# Patient Record
Sex: Male | Born: 1965 | Race: Black or African American | Hispanic: No | Marital: Married | Smoking: Never smoker
Health system: Southern US, Community
[De-identification: ages and names within clinical notes are randomized; demographics above are authoritative.]

## PROBLEM LIST (undated history)

## (undated) DIAGNOSIS — I10 Essential (primary) hypertension: Secondary | ICD-10-CM

## (undated) HISTORY — DX: Essential (primary) hypertension: I10

---

## 2017-09-03 ENCOUNTER — Telehealth: Payer: Self-pay | Admitting: Urology

## 2017-09-03 ENCOUNTER — Encounter: Payer: Self-pay | Admitting: Urology

## 2017-09-03 ENCOUNTER — Ambulatory Visit: Payer: Federal, State, Local not specified - PPO | Admitting: Urology

## 2017-09-03 VITALS — BP 133/77 | HR 76 | Ht 68.0 in | Wt 173.1 lb

## 2017-09-03 DIAGNOSIS — R972 Elevated prostate specific antigen [PSA]: Secondary | ICD-10-CM

## 2017-09-03 DIAGNOSIS — N529 Male erectile dysfunction, unspecified: Secondary | ICD-10-CM | POA: Diagnosis not present

## 2017-09-03 DIAGNOSIS — N401 Enlarged prostate with lower urinary tract symptoms: Secondary | ICD-10-CM

## 2017-09-03 DIAGNOSIS — I10 Essential (primary) hypertension: Secondary | ICD-10-CM | POA: Insufficient documentation

## 2017-09-03 DIAGNOSIS — N138 Other obstructive and reflux uropathy: Secondary | ICD-10-CM

## 2017-09-03 LAB — URINALYSIS, COMPLETE
Bilirubin, UA: NEGATIVE
Glucose, UA: NEGATIVE
KETONES UA: NEGATIVE
LEUKOCYTES UA: NEGATIVE
NITRITE UA: NEGATIVE
Protein, UA: NEGATIVE
Specific Gravity, UA: 1.025 (ref 1.005–1.030)
UUROB: 0.2 mg/dL (ref 0.2–1.0)
pH, UA: 5.5 (ref 5.0–7.5)

## 2017-09-03 NOTE — Telephone Encounter (Signed)
Would you send my note to Dr. Linzie Collin?

## 2017-09-03 NOTE — Progress Notes (Addendum)
09/03/2017 10:10 AM   Joseph Ho 11-18-1966 [id number]  Referring provider: Jerl Mina, MD [address] [facility] [place], [place] [address]  Chief Complaint  Patient presents with  . Elevated PSA    HPI: Patient is a 51 year old African American male who is referred by Dr. Burnett Sheng for elevated PSA .  PSA trend has been:  2.92 ng/mL on 08/07/2015  3.55 ng/mL on 07/30/2016  4.87 ng/mL on 08/06/2017  BPH WITH LUTS  (prostate and/or bladder) His IPSS score today is 1, which is mild lower urinary tract symptomatology.  He is delighted with his quality life due to his urinary symptoms.  His major complaint today is nocturia x 1.  He has had these symptoms for on and off for years.  He states that it not very often that he gets up at night.  He denies any dysuria, hematuria or suprapubic pain.   He also denies any recent fevers, chills, nausea or vomiting.  His father had fatal prostate cancer and passed away at age 39.        IPSS    Row Name 09/03/17 0900         International Prostate Symptom Score   How often have you had the sensation of not emptying your bladder? Not at All     How often have you had to urinate less than every two hours? Not at All     How often have you found you stopped and started again several times when you urinated? Not at All     How often have you found it difficult to postpone urination? Not at All     How often have you had a weak urinary stream? Not at All     How often have you had to strain to start urination? Not at All     How many times did you typically get up at night to urinate? 1 Time     Total IPSS Score 1       Quality of Life due to urinary symptoms   If you were to spend the rest of your life with your urinary condition just the way it is now how would you feel about that? Delighted        Score:  1-7 Mild 8-19 Moderate 20-35 Severe    Erectile dysfunction His SHIM score is 22, which is no ED.   His  libido is preserved.   His risk factors for ED are age, BPH and HTN.   He denies any painful erections or curvatures with his erections.   He is still having spontaneous erections.       SHIM    Row Name 09/03/17 0941         SHIM: Over the last 6 months:   How do you rate your confidence that you could get and keep an erection? High     When you had erections with sexual stimulation, how often were your erections hard enough for penetration (entering your partner)? Most Times (much more than half the time)     During sexual intercourse, how often were you able to maintain your erection after you had penetrated (entered) your partner? Most Times (much more than half the time)     During sexual intercourse, how difficult was it to maintain your erection to completion of intercourse? Not Difficult     When you attempted sexual intercourse, how often was it satisfactory for you? Almost  Always or Always       SHIM Total Score   SHIM 22        Score: 1-7 Severe ED 8-11 Moderate ED 12-16 Mild-Moderate ED 17-21 Mild ED 22-25 No ED    PMH: Past Medical History:  Diagnosis Date  . Hypertension     Surgical History: History reviewed. No pertinent surgical history.  Home Medications:  Allergies as of 09/03/2017   No Known Allergies     Medication List       Accurate as of 09/03/17 10:10 AM. Always use your most recent med list.          lisinopril-hydrochlorothiazide 20-12.5 MG tablet Commonly known as:  PRINZIDE,ZESTORETIC Take by mouth.   meloxicam 15 MG tablet Commonly known as:  MOBIC Take by mouth.            Discharge Care Instructions        Start     Ordered   09/03/17 0000  Urinalysis, Complete     09/03/17 0910   09/03/17 0000  PSA     09/03/17 0935      Allergies: No Known Allergies  Family History: Family History  Problem Relation Age of Onset  . Prostate cancer Father   . Bladder Cancer Neg Hx   . Kidney cancer Neg Hx     Social  History:  reports that he has never smoked. He has never used smokeless tobacco. He reports that he does not drink alcohol or use drugs.  ROS: UROLOGY Frequent Urination?: No Hard to postpone urination?: No Burning/pain with urination?: No Get up at night to urinate?: Yes Leakage of urine?: No Urine stream starts and stops?: No Trouble starting stream?: No Do you have to strain to urinate?: No Blood in urine?: No Urinary tract infection?: No Sexually transmitted disease?: No Injury to kidneys or bladder?: No Painful intercourse?: No Weak stream?: No Erection problems?: No Penile pain?: No  Gastrointestinal Nausea?: No Vomiting?: No Indigestion/heartburn?: No Diarrhea?: No Constipation?: No  Constitutional Fever: No Night sweats?: No Weight loss?: No Fatigue?: No  Skin Skin rash/lesions?: No Itching?: No  Eyes Blurred vision?: No Double vision?: No  Ears/Nose/Throat Sore throat?: No Sinus problems?: No  Hematologic/Lymphatic Swollen glands?: No Easy bruising?: No  Cardiovascular Leg swelling?: No Chest pain?: No  Respiratory Cough?: No Shortness of breath?: No  Endocrine Excessive thirst?: No  Musculoskeletal Back pain?: No Joint pain?: Yes  Neurological Headaches?: No Dizziness?: No  Psychologic Depression?: No Anxiety?: No  Physical Exam: BP 133/77 (BP Location: Left Arm, Patient Position: Sitting, Cuff Size: Normal)   Pulse 76   Ht  (1.727 m)   Wt 173 lb 1.6 oz (78.5 kg)   BMI 26.32 kg/m   Constitutional: Well nourished. Alert and oriented, No acute distress. HEENT: [place] AT, moist mucus membranes. Trachea midline, no masses. Cardiovascular: No clubbing, cyanosis, or edema. Respiratory: Normal respiratory effort, no increased work of breathing. GI: Abdomen is soft, non tender, non distended, no abdominal masses. Liver and spleen not palpable.  No hernias appreciated.  Stool sample for occult testing is not indicated.   GU: No  CVA tenderness.  No bladder fullness or masses.  Patient with uncircumcised phallus. Foreskin easily retracted  Urethral meatus is patent.  No penile discharge. No penile lesions or rashes. Scrotum without lesions, cysts, rashes and/or edema.  Testicles are located scrotally bilaterally. No masses are appreciated in the testicles. Left and right epididymis are normal. Rectal: Patient with  normal  sphincter tone. Anus and perineum without scarring or rashes. No rectal masses are appreciated. Prostate is approximately 35 grams, no nodules are appreciated. Seminal vesicles are normal. Skin: No rashes, bruises or suspicious lesions. Lymph: No cervical or inguinal adenopathy. Neurologic: Grossly intact, no focal deficits, moving all 4 extremities. Psychiatric: Normal mood and affect.  Laboratory Data: Urinalysis Negative.  See EPIC.  I have reviewed the labs.   Assessment & Plan:    1. Elevated PSA  - Urinalysis, Complete  - PSA  2. BPH with LUTS  - IPSS score is 1/0  - Continue conservative management, avoiding bladder irritants and timed voiding's  - RTC pending PSA  3. Erectile dysfunction  - SHIM score is 22  - RTC pending PSA   Return for pending PSA results.  These notes generated with voice recognition software. I apologize for typographical errors.  Michiel Cowboy, PA-C  [facility] [address], [address] [place], [place] [address] [phone]

## 2017-09-03 NOTE — Telephone Encounter (Signed)
done

## 2017-09-04 ENCOUNTER — Telehealth: Payer: Self-pay

## 2017-09-04 LAB — PSA: PROSTATE SPECIFIC AG, SERUM: 4.1 ng/mL — AB (ref 0.0–4.0)

## 2017-09-04 NOTE — Telephone Encounter (Signed)
Spoke w/ Amy at [facility] test added on

## 2017-09-04 NOTE — Telephone Encounter (Signed)
-----   Message from [name], PA-C sent at 09/04/2017  1:26 PM EDT ----- I let Mr. Dozal know his PSA results.

## 2017-09-04 NOTE — Telephone Encounter (Signed)
-----   Message from [name], PA-C sent at 09/04/2017  8:13 AM EDT ----- Please add a free and total PSA.

## 2017-09-05 ENCOUNTER — Other Ambulatory Visit: Payer: Self-pay | Admitting: Urology

## 2017-09-06 LAB — PSA, TOTAL AND FREE
PROSTATE SPECIFIC AG, SERUM: 4.5 ng/mL — AB (ref 0.0–4.0)
PSA FREE: 0.75 ng/mL
PSA, Free Pct: 16.7 %

## 2017-09-06 LAB — SPECIMEN STATUS REPORT

## 2017-09-09 ENCOUNTER — Telehealth: Payer: Self-pay

## 2017-09-09 DIAGNOSIS — R972 Elevated prostate specific antigen [PSA]: Secondary | ICD-10-CM

## 2017-09-09 NOTE — Telephone Encounter (Signed)
-----   Message from [name], PA-C sent at 09/08/2017 12:23 PM EDT ----- I spoke with [name] and explain the results of his blood work to him.  I gave him the option of having a prostate biopsy at this time, obtaining a 4K score for more information or repeating the PSA in 3 months.  I did advise that the prostate biopsy would be the only at this time to know if there is a prostate cancer present and patient understands the risk.   We will make a laboratory appointment for him to have his PSA drawn in 3 months and contact him with that appointment.

## 2017-12-16 ENCOUNTER — Other Ambulatory Visit: Payer: Federal, State, Local not specified - PPO

## 2017-12-16 DIAGNOSIS — R972 Elevated prostate specific antigen [PSA]: Secondary | ICD-10-CM

## 2017-12-17 LAB — PSA: PROSTATE SPECIFIC AG, SERUM: 3.6 ng/mL (ref 0.0–4.0)

## 2017-12-19 ENCOUNTER — Telehealth: Payer: Self-pay

## 2017-12-19 NOTE — Telephone Encounter (Signed)
-----   Message from Harle BattiestShannon A McGowan, PA-C sent at 12/17/2017  9:53 AM EST ----- Please let Mr. Obie DredgeLangley know that his PSA has come down.  I would like to see him in March for an exam.

## 2017-12-19 NOTE — Telephone Encounter (Signed)
Patient notified , please schedule follow up 

## 2017-12-22 ENCOUNTER — Telehealth: Payer: Self-pay | Admitting: Urology

## 2017-12-22 NOTE — Telephone Encounter (Signed)
App made and mailed to pateint  Joseph Ho

## 2018-02-18 NOTE — Progress Notes (Signed)
02/19/2018 9:50 AM   Joseph Ho Aug 09, 1966 [id number]  Referring provider: Jerl MinaHedrick, James, MD [address] [facility] [place], [place] [id number]  No chief complaint on file.   HPI: Patient is a [age] year old African American male with a history of elevated PSA, BPH with LU TS and ED who presents today for a three months follow up.    History of elevated PSA PSA trend has been:  2.92 ng/mL on 08/07/2015  3.55 ng/mL on 07/30/2016  4.87 ng/mL on 08/06/2017  4.5 ng/mL in 08/2017 - PSA, free 0.75 - PSA, Free Pct 16.7% - 17% probability  3.6 ng/mL in 12/2017  BPH WITH LUTS  (prostate and/or bladder) His IPSS score today is 3, which is mild lower urinary tract symptomatology.  He is delighted with his quality life due to his urinary symptoms.  His previous I PSS score was 1/0.  He denies any dysuria, hematuria or suprapubic pain.   He also denies any recent fevers, chills, nausea or vomiting.  His father had fatal prostate cancer and passed away at age 53.    IPSS    Row Name 02/19/18 0900         International Prostate Symptom Score   How often have you had the sensation of not emptying your bladder?  Not at All     How often have you had to urinate less than every two hours?  Not at All     How often have you found you stopped and started again several times when you urinated?  Less than 1 in 5 times     How often have you found it difficult to postpone urination?  Less than 1 in 5 times     How often have you had a weak urinary stream?  Not at All     How often have you had to strain to start urination?  Not at All     How many times did you typically get up at night to urinate?  1 Time     Total IPSS Score  3       Quality of Life due to urinary symptoms   If you were to spend the rest of your life with your urinary condition just the way it is now how would you feel about that?  Delighted        Score:  1-7 Mild 8-19 Moderate 20-35 Severe    Erectile  dysfunction His SHIM score is 20, which is mild ED.   His previous SHIM was 22.  His libido is preserved.   His risk factors for ED are age, BPH and HTN.   He denies any painful erections or curvatures with his erections.   He is still having spontaneous erections.  SHIM    Row Name 02/19/18 0933         SHIM: Over the last 6 months:   How do you rate your confidence that you could get and keep an erection?  Moderate     When you had erections with sexual stimulation, how often were your erections hard enough for penetration (entering your partner)?  Most Times (much more than half the time)     During sexual intercourse, how often were you able to maintain your erection after you had penetrated (entered) your partner?  Most Times (much more than half the time)     During sexual intercourse, how difficult was it to maintain your erection to completion  of intercourse?  Not Difficult     When you attempted sexual intercourse, how often was it satisfactory for you?  Most Times (much more than half the time)       SHIM Total Score   SHIM  20        Score: 1-7 Severe ED 8-11 Moderate ED 12-16 Mild-Moderate ED 17-21 Mild ED 22-25 No ED    PMH: Past Medical History:  Diagnosis Date  . Hypertension     Surgical History: No past surgical history on file.  Home Medications:  Allergies as of 02/19/2018   No Known Allergies     Medication List        Accurate as of 02/19/18  9:50 AM. Always use your most recent med list.          lisinopril-hydrochlorothiazide 20-12.5 MG tablet Commonly known as:  PRINZIDE,ZESTORETIC Take by mouth.   meloxicam 15 MG tablet Commonly known as:  MOBIC Take by mouth.       Allergies: No Known Allergies  Family History: Family History  Problem Relation Age of Onset  . Prostate cancer Father   . Bladder Cancer Neg Hx   . Kidney cancer Neg Hx     Social History:  reports that  has never smoked. he has never used smokeless tobacco. He  reports that he does not drink alcohol or use drugs.  ROS: UROLOGY Frequent Urination?: No Hard to postpone urination?: No Burning/pain with urination?: No Get up at night to urinate?: No Leakage of urine?: No Urine stream starts and stops?: No Trouble starting stream?: No Do you have to strain to urinate?: No Blood in urine?: No Urinary tract infection?: No Sexually transmitted disease?: No Injury to kidneys or bladder?: No Painful intercourse?: No Weak stream?: No Erection problems?: No Penile pain?: No  Gastrointestinal Nausea?: No Vomiting?: No Indigestion/heartburn?: No Diarrhea?: No Constipation?: No  Constitutional Fever: No Night sweats?: No Weight loss?: No Fatigue?: No  Skin Skin rash/lesions?: No Itching?: No  Eyes Blurred vision?: No Double vision?: No  Ears/Nose/Throat Sore throat?: No Sinus problems?: No  Hematologic/Lymphatic Swollen glands?: No Easy bruising?: No  Cardiovascular Leg swelling?: No Chest pain?: No  Respiratory Cough?: No Shortness of breath?: No  Endocrine Excessive thirst?: No  Musculoskeletal Back pain?: No Joint pain?: No  Neurological Headaches?: No Dizziness?: No  Psychologic Depression?: No Anxiety?: No  Physical Exam: BP (!) 141/96   Pulse 65   Resp 16   Ht 5\' 8"  (1.727 m)   Wt 174 lb (78.9 kg)   SpO2 98%   BMI 26.46 kg/m   Constitutional: Well nourished. Alert and oriented, No acute distress. HEENT: [place] AT, moist mucus membranes. Trachea midline, no masses. Cardiovascular: No clubbing, cyanosis, or edema. Respiratory: Normal respiratory effort, no increased work of breathing. GI: Abdomen is soft, non tender, non distended, no abdominal masses. Liver and spleen not palpable.  No hernias appreciated.  Stool sample for occult testing is not indicated.   GU: No CVA tenderness.  No bladder fullness or masses.  Patient with uncircumcised phallus.  Foreskin easily retracted  Urethral meatus is  patent.  No penile discharge. No penile lesions or rashes. Scrotum without lesions, cysts, rashes and/or edema.  Testicles are located scrotally bilaterally. No masses are appreciated in the testicles. Left and right epididymis are normal. Rectal: Patient with  normal sphincter tone. Anus and perineum without scarring or rashes. No rectal masses are appreciated. Prostate is approximately 35 grams, no nodules are appreciated.  Seminal vesicles are normal. Skin: No rashes, bruises or suspicious lesions. Lymph: No cervical or inguinal adenopathy. Neurologic: Grossly intact, no focal deficits, moving all 4 extremities. Psychiatric: Normal mood and affect.    Laboratory Data: See PSA in HPI  I have reviewed the labs.   Assessment & Plan:    1. History of elevated PSA  - Recent PSA 3.6  - PSA repeated today  2. BPH with LUTS  - IPSS score is 3/0, it is slightly worse  - Continue conservative management, avoiding bladder irritants and timed voiding's  - RTC pending PSA  3. Erectile dysfunction  - SHIM score is 20 ,it is worsening  - RTC pending PSA   Return for pending PSA results .  These notes generated with voice recognition software. I apologize for typographical errors.  Michiel Cowboy, PA-C  [facility] [address], [address] [place], [place] [address] [phone]

## 2018-02-19 ENCOUNTER — Ambulatory Visit: Payer: Federal, State, Local not specified - PPO | Admitting: Urology

## 2018-02-19 ENCOUNTER — Encounter: Payer: Self-pay | Admitting: Urology

## 2018-02-19 VITALS — BP 141/96 | HR 65 | Resp 16 | Ht 68.0 in | Wt 174.0 lb

## 2018-02-19 DIAGNOSIS — Z87898 Personal history of other specified conditions: Secondary | ICD-10-CM

## 2018-02-19 DIAGNOSIS — N529 Male erectile dysfunction, unspecified: Secondary | ICD-10-CM

## 2018-02-19 DIAGNOSIS — N138 Other obstructive and reflux uropathy: Secondary | ICD-10-CM

## 2018-02-19 DIAGNOSIS — N401 Enlarged prostate with lower urinary tract symptoms: Secondary | ICD-10-CM

## 2018-02-20 LAB — PSA: PROSTATE SPECIFIC AG, SERUM: 4.5 ng/mL — AB (ref 0.0–4.0)

## 2018-02-20 NOTE — Addendum Note (Signed)
Addended by: Vickki HearingHOMPSON, CHRISTOPHER L on: 02/20/2018 08:54 AM   Modules accepted: Orders

## 2018-03-04 ENCOUNTER — Other Ambulatory Visit: Payer: Federal, State, Local not specified - PPO

## 2018-03-04 ENCOUNTER — Other Ambulatory Visit: Payer: Self-pay

## 2018-03-04 DIAGNOSIS — Z87898 Personal history of other specified conditions: Secondary | ICD-10-CM

## 2018-03-05 ENCOUNTER — Telehealth: Payer: Self-pay

## 2018-03-05 LAB — PSA: Prostate Specific Ag, Serum: 4.3 ng/mL — ABNORMAL HIGH (ref 0.0–4.0)

## 2018-03-05 NOTE — Telephone Encounter (Signed)
-----   Message from Harle BattiestShannon A McGowan, PA-C sent at 03/05/2018  7:51 AM EDT ----- Please let Mr. Joseph DredgeLangley know that his PSA is somewhat stable at 4.3.  I would like it repeated in 3 months.

## 2018-03-05 NOTE — Telephone Encounter (Signed)
LMOM

## 2018-03-06 NOTE — Telephone Encounter (Signed)
Spoke w/ pt and informed him of results. Pt will repeat psa in 3 months.

## 2018-03-14 LAB — PSA, TOTAL AND FREE
PROSTATE SPECIFIC AG, SERUM: 4.7 ng/mL — AB (ref 0.0–4.0)
PSA, Free Pct: 18.1 %
PSA, Free: 0.85 ng/mL

## 2018-03-14 LAB — SPECIMEN STATUS REPORT

## 2018-06-05 ENCOUNTER — Other Ambulatory Visit: Payer: Federal, State, Local not specified - PPO

## 2018-06-05 DIAGNOSIS — Z87898 Personal history of other specified conditions: Secondary | ICD-10-CM

## 2018-06-06 LAB — PSA: PROSTATE SPECIFIC AG, SERUM: 4 ng/mL (ref 0.0–4.0)

## 2018-06-10 ENCOUNTER — Telehealth: Payer: Self-pay | Admitting: Urology

## 2018-06-10 NOTE — Telephone Encounter (Signed)
Pt called asking for PSA results from last Friday.  I read results to him 4.0.

## 2018-06-29 ENCOUNTER — Telehealth: Payer: Self-pay | Admitting: Urology

## 2018-06-29 NOTE — Telephone Encounter (Signed)
Left message for patient to call me back. 

## 2018-07-01 ENCOUNTER — Other Ambulatory Visit: Payer: Self-pay | Admitting: Urology

## 2018-07-01 MED ORDER — FINASTERIDE 5 MG PO TABS: 5.0000 mg | ORAL_TABLET | Freq: Every day | ORAL | 3 refills | Status: DC

## 2018-07-01 NOTE — Progress Notes (Signed)
Explained to Mr. Joseph Ho that his PSA is higher than I am comfortable with and I am concerned that this may be the sign of prostate cancer.  I have recommended undergoing a biopsy or taking the medication finasteride to see how the PSA responds. He would like to take the finasteride for one month and repeat the PSA.  I did advise him that if prostate cancer is the cause of his PSA levels, it would cause a delay in diagnosis and treatment.  He is understanding of this and would like to take the finasteride and if no changes are seen in the PSA schedule a biopsy at that time.

## 2018-07-01 NOTE — Telephone Encounter (Signed)
Patient returned your call he can be reached at [phone]   [facility]

## 2018-07-01 NOTE — Telephone Encounter (Signed)
LMOM for patient to call me back 

## 2018-07-24 ENCOUNTER — Other Ambulatory Visit: Payer: Self-pay | Admitting: Family Medicine

## 2018-07-24 DIAGNOSIS — Z87898 Personal history of other specified conditions: Secondary | ICD-10-CM

## 2018-07-30 ENCOUNTER — Other Ambulatory Visit: Payer: Federal, State, Local not specified - PPO

## 2018-07-30 DIAGNOSIS — Z87898 Personal history of other specified conditions: Secondary | ICD-10-CM

## 2018-07-31 LAB — PSA: PROSTATE SPECIFIC AG, SERUM: 3 ng/mL (ref 0.0–4.0)

## 2018-08-16 ENCOUNTER — Telehealth: Payer: Self-pay | Admitting: Urology

## 2018-08-16 NOTE — Telephone Encounter (Signed)
Please call Mr. Auer to schedule a lab visit for a PSA in one month.

## 2018-08-17 NOTE — Telephone Encounter (Signed)
Left message for patient to cb to schedule app If he wants to go to [facility] he does not need an app he can just go to the lab in a month to get this done.   Marcelino Duster

## 2018-08-20 NOTE — Telephone Encounter (Signed)
Did you want a month from his last PSA?   Joseph DusterMichelle

## 2018-08-20 NOTE — Telephone Encounter (Signed)
Yes. One month from last PSA.

## 2018-08-27 ENCOUNTER — Other Ambulatory Visit: Payer: Federal, State, Local not specified - PPO

## 2018-08-27 DIAGNOSIS — Z87898 Personal history of other specified conditions: Secondary | ICD-10-CM

## 2018-08-28 LAB — PSA: PROSTATE SPECIFIC AG, SERUM: 2.6 ng/mL (ref 0.0–4.0)

## 2018-08-31 ENCOUNTER — Ambulatory Visit: Payer: Federal, State, Local not specified - PPO | Admitting: Urology

## 2018-08-31 NOTE — Progress Notes (Deleted)
08/31/2018 5:26 AM   Joseph Ho 1966-01-11 [id number]  Referring provider: Jerl Mina, MD [address] [facility] [name], [place] [address]  No chief complaint on file.   HPI: Patient is a 52 year old African American male with a history of elevated PSA, BPH with LU TS and ED who presents today for a six months follow up.    History of elevated PSA PSA trend has been:  2.92 ng/mL on 08/07/2015  3.55 ng/mL on 07/30/2016  4.87 ng/mL on 08/06/2017  4.5 ng/mL in 08/2017 - PSA, free 0.75 - PSA, Free Pct 16.7% - 17% probability  3.6 ng/mL in 12/2017  4.5 ng/mL in 02/2018  4.0 ng/mL in 05/2018  3.0 ng/mL in 07/2018 - started finasteride   2.6 ng/mL in 08/2018  BPH WITH LUTS  (prostate and/or bladder) His IPSS score today is ***, which is *** lower urinary tract symptomatology.  He is *** with his quality life due to his urinary symptoms.  His previous I PSS score was 3/0.  He denies any dysuria, hematuria or suprapubic pain.   He also denies any recent fevers, chills, nausea or vomiting.  His father had fatal prostate cancer and passed away at age 39.      Score:  1-7 Mild 8-19 Moderate 20-35 Severe    Erectile dysfunction His SHIM score is ***, which is *** ED.   His previous SHIM was 20.  His libido is preserved.   His risk factors for ED are age, BPH and HTN.   He denies any painful erections or curvatures with his erections.   He is still having spontaneous erections.    Score: 1-7 Severe ED 8-11 Moderate ED 12-16 Mild-Moderate ED 17-21 Mild ED 22-25 No ED    PMH: Past Medical History:  Diagnosis Date  . Hypertension     Surgical History: No past surgical history on file.  Home Medications:  Allergies as of 08/31/2018   No Known Allergies     Medication List        Accurate as of 08/31/18  5:26 AM. Always use your most recent med list.          finasteride 5 MG tablet Commonly known as:  PROSCAR Take 1 tablet (5 mg total) by  mouth daily.   lisinopril-hydrochlorothiazide 20-12.5 MG tablet Commonly known as:  PRINZIDE,ZESTORETIC Take by mouth.       Allergies: No Known Allergies  Family History: Family History  Problem Relation Age of Onset  . Prostate cancer Father   . Bladder Cancer Neg Hx   . Kidney cancer Neg Hx     Social History:  reports that he has never smoked. He has never used smokeless tobacco. He reports that he does not drink alcohol or use drugs.  ROS:                                        Physical Exam: There were no vitals taken for this visit.  Constitutional: Well nourished. Alert and oriented, No acute distress. HEENT: [place] AT, moist mucus membranes. Trachea midline, no masses. Cardiovascular: No clubbing, cyanosis, or edema. Respiratory: Normal respiratory effort, no increased work of breathing. GI: Abdomen is soft, non tender, non distended, no abdominal masses. Liver and spleen not palpable.  No hernias appreciated.  Stool sample for occult testing is not indicated.   GU: No  CVA tenderness.  No bladder fullness or masses.  Patient with circumcised/uncircumcised phallus. ***Foreskin easily retracted***  Urethral meatus is patent.  No penile discharge. No penile lesions or rashes. Scrotum without lesions, cysts, rashes and/or edema.  Testicles are located scrotally bilaterally. No masses are appreciated in the testicles. Left and right epididymis are normal. Rectal: Patient with  normal sphincter tone. Anus and perineum without scarring or rashes. No rectal masses are appreciated. Prostate is approximately *** grams, *** nodules are appreciated. Seminal vesicles are normal. Skin: No rashes, bruises or suspicious lesions. Lymph: No cervical or inguinal adenopathy. Neurologic: Grossly intact, no focal deficits, moving all 4 extremities. Psychiatric: Normal mood and affect.   Laboratory Data: See PSA in HPI  I have reviewed the labs.   Assessment &  Plan:    1. History of elevated PSA Recent PSA 2.6 Continue finasteride RTC in one month for PSA   2. BPH with LUTS IPSS score is 3/0, it is slightly worse Continue conservative management, avoiding bladder irritants and timed voiding's Continue finasteride RTC in 6 months for I PSS, PSA and exam - if PSA continues to reduce  3. Erectile dysfunction SHIM score is 20 ,it is worsening    No follow-ups on file.  These notes generated with voice recognition software. I apologize for typographical errors.  Joseph CowboySHANNON Carmine Youngberg, PA-C  [facility] [address] [place], [place] [address] [phone]

## 2018-09-01 ENCOUNTER — Telehealth: Payer: Self-pay | Admitting: Urology

## 2018-09-01 NOTE — Telephone Encounter (Signed)
He is coming in on Thursday to see you. He had his labs drawn on the 19th and was supposed to see you on the 23rd but missed that app.  Joseph DusterMichelle

## 2018-09-01 NOTE — Telephone Encounter (Signed)
Mr. Joseph Ho did not show for his appointment yesterday.  I need him to come back in one month for a PSA/OV.

## 2018-09-03 ENCOUNTER — Ambulatory Visit (INDEPENDENT_AMBULATORY_CARE_PROVIDER_SITE_OTHER): Payer: Federal, State, Local not specified - PPO | Admitting: Urology

## 2018-09-03 ENCOUNTER — Encounter: Payer: Self-pay | Admitting: Urology

## 2018-09-03 VITALS — BP 130/80 | HR 79 | Ht 69.0 in | Wt 180.7 lb

## 2018-09-03 DIAGNOSIS — N138 Other obstructive and reflux uropathy: Secondary | ICD-10-CM

## 2018-09-03 DIAGNOSIS — Z87898 Personal history of other specified conditions: Secondary | ICD-10-CM

## 2018-09-03 DIAGNOSIS — N401 Enlarged prostate with lower urinary tract symptoms: Secondary | ICD-10-CM | POA: Diagnosis not present

## 2018-09-03 MED ORDER — FINASTERIDE 5 MG PO TABS: 5.0000 mg | ORAL_TABLET | Freq: Every day | ORAL | 3 refills | Status: DC

## 2018-09-03 NOTE — Progress Notes (Signed)
09/03/2018 10:08 AM   Joseph Ho 02/19/1966 [id number]  Referring provider: Jerl Mina, MD [address] [facility] [place], [place] [address]  Chief Complaint  Patient presents with  . Elevated PSA    HPI: Patient is a 52 year old African American male with a history of elevated PSA, BPH with LU TS and ED who presents today for a six months follow up.    History of elevated PSA PSA trend has been:  2.92 ng/mL on 08/07/2015  3.55 ng/mL on 07/30/2016  4.87 ng/mL on 08/06/2017  4.5 ng/mL in 08/2017 - PSA, free 0.75 - PSA, Free Pct 16.7% - 17% probability  3.6 ng/mL in 12/2017  4.5 ng/mL in 02/2018  4.0 ng/mL in 05/2018  3.0 ng/mL in 07/2018 - started finasteride   2.6 ng/mL in 08/2018  BPH WITH LUTS  (prostate and/or bladder) His IPSS score today 1, which is mild lower urinary tract symptomatology.  He is delighted with his quality life due to his urinary symptoms.  His previous I PSS score was 3/0.  He denies any dysuria, hematuria or suprapubic pain.   He also denies any recent fevers, chills, nausea or vomiting.  His father had fatal prostate cancer and passed away at age 62.    IPSS    Row Name 09/03/18 0900         International Prostate Symptom Score   How often have you had the sensation of not emptying your bladder?  Not at All     How often have you had to urinate less than every two hours?  Not at All     How often have you found you stopped and started again several times when you urinated?  Not at All     How often have you found it difficult to postpone urination?  Not at All     How often have you had a weak urinary stream?  Not at All     How often have you had to strain to start urination?  Not at All     How many times did you typically get up at night to urinate?  1 Time     Total IPSS Score  1       Quality of Life due to urinary symptoms   If you were to spend the rest of your life with your urinary condition just the way it is now  how would you feel about that?  Delighted        Score:  1-7 Mild 8-19 Moderate 20-35 Severe    PMH: Past Medical History:  Diagnosis Date  . Hypertension     Surgical History: No past surgical history on file.  Home Medications:  Allergies as of 09/03/2018   No Known Allergies     Medication List        Accurate as of 09/03/18 10:08 AM. Always use your most recent med list.          finasteride 5 MG tablet Commonly known as:  PROSCAR Take 1 tablet (5 mg total) by mouth daily.   lisinopril-hydrochlorothiazide 20-12.5 MG tablet Commonly known as:  PRINZIDE,ZESTORETIC Take by mouth.       Allergies: No Known Allergies  Family History: Family History  Problem Relation Age of Onset  . Prostate cancer Father   . Bladder Cancer Neg Hx   . Kidney cancer Neg Hx     Social History:  reports that he has never  smoked. He has never used smokeless tobacco. He reports that he does not drink alcohol or use drugs.  ROS: UROLOGY Frequent Urination?: No Hard to postpone urination?: No Burning/pain with urination?: No Get up at night to urinate?: No Leakage of urine?: No Urine stream starts and stops?: No Trouble starting stream?: No Do you have to strain to urinate?: No Blood in urine?: No Urinary tract infection?: No Sexually transmitted disease?: No Injury to kidneys or bladder?: No Painful intercourse?: No Weak stream?: No Erection problems?: No Penile pain?: No  Gastrointestinal Nausea?: No Vomiting?: No Indigestion/heartburn?: No Diarrhea?: No Constipation?: No  Constitutional Fever: No Night sweats?: No Weight loss?: No Fatigue?: No  Skin Skin rash/lesions?: No Itching?: No  Eyes Blurred vision?: No Double vision?: No  Ears/Nose/Throat Sore throat?: No Sinus problems?: No  Hematologic/Lymphatic Swollen glands?: No Easy bruising?: No  Cardiovascular Leg swelling?: No Chest pain?: No  Respiratory Cough?: No Shortness of  breath?: No  Endocrine Excessive thirst?: No  Musculoskeletal Back pain?: No Joint pain?: No  Neurological Headaches?: No Dizziness?: No  Psychologic Depression?: No Anxiety?: No  Physical Exam: BP 130/80 (BP Location: Left Arm, Patient Position: Sitting, Cuff Size: Normal)   Pulse 79   Ht 5\' 9"  (1.753 m)   Wt 180 lb 11.2 oz (82 kg)   BMI 26.68 kg/m   Constitutional: Well nourished. Alert and oriented, No acute distress. HEENT: [place] AT, moist mucus membranes. Trachea midline, no masses. Cardiovascular: No clubbing, cyanosis, or edema. Respiratory: Normal respiratory effort, no increased work of breathing. GI: Abdomen is soft, non tender, non distended, no abdominal masses. Liver and spleen not palpable.  No hernias appreciated.  Stool sample for occult testing is not indicated.   GU: No CVA tenderness.  No bladder fullness or masses.  Patient with uncircumcised phallus.   Foreskin easily retracted Urethral meatus is patent.  No penile discharge. No penile lesions or rashes. Scrotum without lesions, cysts, rashes and/or edema.  Testicles are located scrotally bilaterally. No masses are appreciated in the testicles. Left and right epididymis are normal. Rectal: Patient with  normal sphincter tone. Anus and perineum without scarring or rashes. No rectal masses are appreciated. Prostate is approximately 45 grams, no nodules are appreciated. Seminal vesicles are normal. Skin: No rashes, bruises or suspicious lesions. Lymph: No cervical or inguinal adenopathy. Neurologic: Grossly intact, no focal deficits, moving all 4 extremities. Psychiatric: Normal mood and affect.   Laboratory Data: See PSA in HPI  I have reviewed the labs.   Assessment & Plan:    1. History of elevated PSA Recent PSA 2.6 Continue finasteride RTC in two months for PSA only   2. BPH with LUTS IPSS score is 1/0, it is improved Continue conservative management, avoiding bladder irritants and timed  voiding's Continue finasteride RTC in 6 months for I PSS, PSA and exam - if PSA continues to reduce   Return in about 2 months (around 11/03/2018) for PSA only .  These notes generated with voice recognition software. I apologize for typographical errors.  Michiel Cowboy, PA-C  [facility] [address] [place], [place] [address] [phone]

## 2018-09-08 ENCOUNTER — Other Ambulatory Visit: Payer: Self-pay | Admitting: Urology

## 2018-09-08 MED ORDER — TADALAFIL 20 MG PO TABS: 20.0000 mg | ORAL_TABLET | Freq: Every day | ORAL | 3 refills | Status: DC | PRN

## 2018-09-08 NOTE — Progress Notes (Signed)
Rx for Cialis is sent to pharmacy.

## 2018-09-09 ENCOUNTER — Ambulatory Visit: Payer: Federal, State, Local not specified - PPO | Admitting: Urology

## 2018-11-02 ENCOUNTER — Other Ambulatory Visit: Payer: Self-pay

## 2018-11-02 DIAGNOSIS — Z87898 Personal history of other specified conditions: Secondary | ICD-10-CM

## 2018-11-03 ENCOUNTER — Other Ambulatory Visit: Payer: Federal, State, Local not specified - PPO

## 2018-11-03 DIAGNOSIS — Z87898 Personal history of other specified conditions: Secondary | ICD-10-CM

## 2018-11-04 LAB — PSA: PROSTATE SPECIFIC AG, SERUM: 2.7 ng/mL (ref 0.0–4.0)

## 2018-11-16 ENCOUNTER — Other Ambulatory Visit: Payer: Federal, State, Local not specified - PPO

## 2018-11-16 DIAGNOSIS — Z87898 Personal history of other specified conditions: Secondary | ICD-10-CM

## 2018-11-17 LAB — PSA: PROSTATE SPECIFIC AG, SERUM: 2.6 ng/mL (ref 0.0–4.0)

## 2018-11-18 ENCOUNTER — Other Ambulatory Visit: Payer: Self-pay | Admitting: Urology

## 2018-11-18 DIAGNOSIS — R972 Elevated prostate specific antigen [PSA]: Secondary | ICD-10-CM

## 2018-11-18 NOTE — Progress Notes (Signed)
Orders for MRI in chart.

## 2018-12-03 ENCOUNTER — Ambulatory Visit
Admission: RE | Admit: 2018-12-03 | Discharge: 2018-12-03 | Disposition: A | Discharge status: Home / Self Care | Payer: Federal, State, Local not specified - PPO | Source: Ambulatory Visit | Attending: Urology | Admitting: Urology

## 2018-12-03 DIAGNOSIS — R972 Elevated prostate specific antigen [PSA]: Secondary | ICD-10-CM

## 2018-12-03 MED ORDER — GADOBENATE DIMEGLUMINE 529 MG/ML IV SOLN
17.0000 mL | Freq: Once | INTRAVENOUS | Status: AC | PRN
  Administered 2018-12-03: 17 mL via INTRAVENOUS

## 2018-12-11 ENCOUNTER — Other Ambulatory Visit: Payer: Self-pay

## 2018-12-11 DIAGNOSIS — R972 Elevated prostate specific antigen [PSA]: Secondary | ICD-10-CM

## 2019-02-09 ENCOUNTER — Other Ambulatory Visit: Payer: Self-pay

## 2019-02-11 ENCOUNTER — Ambulatory Visit: Payer: Self-pay | Admitting: Urology

## 2019-03-04 ENCOUNTER — Other Ambulatory Visit: Payer: Self-pay | Admitting: Radiology

## 2019-03-04 DIAGNOSIS — R972 Elevated prostate specific antigen [PSA]: Secondary | ICD-10-CM

## 2019-03-12 ENCOUNTER — Other Ambulatory Visit: Payer: Self-pay

## 2019-03-15 ENCOUNTER — Ambulatory Visit: Payer: Self-pay | Admitting: Urology

## 2019-03-16 ENCOUNTER — Ambulatory Visit: Payer: Self-pay | Admitting: Urology

## 2019-03-16 ENCOUNTER — Telehealth: Payer: Self-pay | Admitting: Urology

## 2019-03-23 ENCOUNTER — Telehealth: Payer: Federal, State, Local not specified - PPO | Admitting: Urology

## 2019-03-23 ENCOUNTER — Other Ambulatory Visit: Payer: Self-pay

## 2019-03-23 ENCOUNTER — Telehealth: Payer: Self-pay | Admitting: Urology

## 2019-03-23 NOTE — Telephone Encounter (Signed)
I called him and told him, he was going to cx anyway because his insurance has not come thru yet. He said he wanted to have the labs done in [place] once his insurance did come in and he would cb to reschd the app.    Thanks, Marcelino Duster

## 2019-03-23 NOTE — Telephone Encounter (Signed)
Mr. Joseph Ho was down for a virtual visit with me today, but he did not get his PSA drawn.  Would you call him and reschedule his appointment and make sure he gets his blood work done prior to the appointment?

## 2019-07-13 NOTE — Telephone Encounter (Signed)
Would you call Joseph Ho and schedule him for an appointment with a PSA prior?

## 2019-07-13 NOTE — Telephone Encounter (Signed)
I called pt and he states that he will call back later to make appt.

## 2019-08-06 NOTE — Telephone Encounter (Signed)
Would you reach out to Mr. Fiorito and get him scheduled for a follow up appointment with a PSA prior?

## 2019-08-19 ENCOUNTER — Other Ambulatory Visit: Payer: Self-pay

## 2019-08-19 ENCOUNTER — Other Ambulatory Visit
Admission: RE | Admit: 2019-08-19 | Discharge: 2019-08-19 | Disposition: A | Discharge status: Home / Self Care | Payer: Federal, State, Local not specified - PPO | Attending: Urology | Admitting: Urology

## 2019-08-19 DIAGNOSIS — R972 Elevated prostate specific antigen [PSA]: Secondary | ICD-10-CM | POA: Diagnosis present

## 2019-08-19 LAB — PSA: Prostatic Specific Antigen: 2.07 ng/mL (ref 0.00–4.00)

## 2019-08-21 NOTE — Progress Notes (Signed)
08/23/2019 8:42 AM   Joseph Ho Dec 01, 1966 [id number]  Referring provider: Jerl Mina, MD [address] [facility] [place],  [place] [address]  Chief Complaint  Patient presents with  . Elevated PSA    HPI: Patient is a 53 year old male with a history of elevated PSA, BPH with LU TS and ED who presents today for a six months follow up.    History of elevated PSA PSA trend has been:  2.92 ng/mL on 08/07/2015  3.55 ng/mL on 07/30/2016  4.87 ng/mL on 08/06/2017  4.5 ng/mL in 08/2017 - PSA, free 0.75 - PSA, Free Pct 16.7% - 17% probability  3.6 ng/mL in 12/2017  4.5 ng/mL in 02/2018  4.0 ng/mL in 05/2018  3.0 ng/mL in 07/2018 - started finasteride   2.6 (5.2) ng/mL in 08/2018  2.7 (5.4) ng/mL in 10/2018  2.6 (5.2) ng/mL in 11/2018 - prostate MRI no evidence of high-grade carcinoma in the peripheral zone.  Mild prostate enlargement.  PSAD 0.108.  PSAD less than 0.15 ng/mL/mL was the most well studied and accurate negative predictive factor for clinically significant prostate cancer diagnosis.  Recommend the use of PSAD less than 0.15 ng/mL/mL along with negative MRI results to omit biopsy indication in cancer naive patients.  Predictive Factors of Missed Clinically Significant Prostate Cancers in Men with Negative Magnetic Resonance Imaging: A Systematic Review and Meta-Analysis.  Journal of Urology: Vol. 204, 24-32 July 2020.   2.07 (4.14) ng/mL in 08/2019  BPH WITH LUTS  (prostate and/or bladder) His IPSS score today 1, which is mild lower urinary tract symptomatology.  He is pleased with his quality life due to his urinary symptoms.  His previous I PSS score was 1/0.  He denies any dysuria, hematuria or suprapubic pain.   He also denies any recent fevers, chills, nausea or vomiting.  His father had fatal prostate cancer and passed away at age 89.   IPSS    Row Name 08/23/19 0800         International Prostate Symptom Score   How often have you had the  sensation of not emptying your bladder?  Not at All     How often have you had to urinate less than every two hours?  Not at All     How often have you found you stopped and started again several times when you urinated?  Not at All     How often have you found it difficult to postpone urination?  Not at All     How often have you had a weak urinary stream?  Not at All     How often have you had to strain to start urination?  Not at All     How many times did you typically get up at night to urinate?  1 Time     Total IPSS Score  1       Quality of Life due to urinary symptoms   If you were to spend the rest of your life with your urinary condition just the way it is now how would you feel about that?  Pleased        Score:  1-7 Mild 8-19 Moderate 20-35 Severe    ED Cialis not effective.  He is still having spontaneous erections.  He denies any pain or curvature with his erections.      PMH: Past Medical History:  Diagnosis Date  . Hypertension     Surgical History:  History reviewed. No pertinent surgical history.  Home Medications:  Allergies as of 08/23/2019   No Known Allergies     Medication List       Accurate as of August 23, 2019  8:42 AM. If you have any questions, ask your nurse or doctor.        finasteride 5 MG tablet Commonly known as: Proscar Take 1 tablet (5 mg total) by mouth daily.   lisinopril-hydrochlorothiazide 20-12.5 MG tablet Commonly known as: ZESTORETIC Take by mouth.   tadalafil 20 MG tablet Commonly known as: Cialis Take 1 tablet (20 mg total) by mouth daily as needed for erectile dysfunction.       Allergies: No Known Allergies  Family History: Family History  Problem Relation Age of Onset  . Prostate cancer Father   . Bladder Cancer Neg Hx   . Kidney cancer Neg Hx     Social History:  reports that he has never smoked. He has never used smokeless tobacco. He reports that he does not drink alcohol or use drugs.  ROS:  UROLOGY Frequent Urination?: No Hard to postpone urination?: No Burning/pain with urination?: No Get up at night to urinate?: No Leakage of urine?: No Urine stream starts and stops?: No Trouble starting stream?: No Do you have to strain to urinate?: No Blood in urine?: No Urinary tract infection?: No Sexually transmitted disease?: No Injury to kidneys or bladder?: No Painful intercourse?: No Weak stream?: No Erection problems?: Yes Penile pain?: No  Gastrointestinal Nausea?: No Vomiting?: No Indigestion/heartburn?: No Diarrhea?: No Constipation?: No  Constitutional Fever: No Night sweats?: No Weight loss?: No Fatigue?: No  Skin Skin rash/lesions?: No Itching?: No  Eyes Blurred vision?: No Double vision?: No  Ears/Nose/Throat Sore throat?: No Sinus problems?: No  Hematologic/Lymphatic Swollen glands?: No Easy bruising?: No  Cardiovascular Leg swelling?: No Chest pain?: No  Respiratory Cough?: No Shortness of breath?: No  Endocrine Excessive thirst?: No  Musculoskeletal Back pain?: No Joint pain?: No  Neurological Headaches?: No Dizziness?: No  Psychologic Depression?: No Anxiety?: No  Physical Exam: BP 125/84 (BP Location: Left Arm, Patient Position: Sitting, Cuff Size: Normal)   Pulse 83   Ht 5\' 9"  (1.753 m)   Wt 186 lb (84.4 kg)   BMI 27.47 kg/m   Constitutional:  Well nourished. Alert and oriented, No acute distress. HEENT: [place] AT, moist mucus membranes.  Trachea midline, no masses. Cardiovascular: No clubbing, cyanosis, or edema. Respiratory: Normal respiratory effort, no increased work of breathing. GI: Abdomen is soft, non tender, non distended, no abdominal masses. Liver and spleen not palpable.  No hernias appreciated.  Stool sample for occult testing is not indicated.   GU: No CVA tenderness.  No bladder fullness or masses.  Patient with uncircumcised phallus. Foreskin easily retracted Urethral meatus is patent.  No penile  discharge. No penile lesions or rashes. Scrotum without lesions, cysts, rashes and/or edema.  Testicles are located scrotally bilaterally. No masses are appreciated in the testicles. Left and right epididymis are normal. Rectal: Patient with  normal sphincter tone. Anus and perineum without scarring or rashes. No rectal masses are appreciated. Prostate is approximately 45 grams, no nodules are appreciated. Seminal vesicles are normal. Skin: No rashes, bruises or suspicious lesions. Lymph: No inguinal adenopathy. Neurologic: Grossly intact, no focal deficits, moving all 4 extremities. Psychiatric: Normal mood and affect.   Laboratory Data: See PSA in HPI  I have reviewed the labs.   Assessment & Plan:    1. History of  elevated PSA PSA stable  Continue finasteride RTC in 6 months for PSA and exam  2. BPH with LUTS IPSS score is 1/1, it is slightly worse Continue conservative management, avoiding bladder irritants and timed voiding's Continue finasteride - refill given  RTC in 6 months for I PSS, PSA and exam - if PSA continues to reduce  3. ED Cialis 20 mg ineffective Will have a trial of Viagra 100 mg RTC in 6 months for SHIM and exam    Return in about 6 months (around 02/20/2020) for IPSS, SHIM, PSA and exam.  These notes generated with voice recognition software. I apologize for typographical errors.  Zara Council, PA-C  [facility] [address] [place], [place] [address] [phone]

## 2019-08-23 ENCOUNTER — Other Ambulatory Visit: Payer: Self-pay

## 2019-08-23 ENCOUNTER — Ambulatory Visit (INDEPENDENT_AMBULATORY_CARE_PROVIDER_SITE_OTHER): Payer: Federal, State, Local not specified - PPO | Admitting: Urology

## 2019-08-23 ENCOUNTER — Encounter: Payer: Self-pay | Admitting: Urology

## 2019-08-23 VITALS — BP 125/84 | HR 83 | Ht 69.0 in | Wt 186.0 lb

## 2019-08-23 DIAGNOSIS — Z87898 Personal history of other specified conditions: Secondary | ICD-10-CM | POA: Diagnosis not present

## 2019-08-23 DIAGNOSIS — N138 Other obstructive and reflux uropathy: Secondary | ICD-10-CM | POA: Diagnosis not present

## 2019-08-23 DIAGNOSIS — N529 Male erectile dysfunction, unspecified: Secondary | ICD-10-CM

## 2019-08-23 DIAGNOSIS — N401 Enlarged prostate with lower urinary tract symptoms: Secondary | ICD-10-CM | POA: Diagnosis not present

## 2019-08-23 MED ORDER — SILDENAFIL CITRATE 100 MG PO TABS: 100.0000 mg | ORAL_TABLET | Freq: Every day | ORAL | 3 refills | Status: DC | PRN

## 2019-08-23 MED ORDER — FINASTERIDE 5 MG PO TABS: 5.0000 mg | ORAL_TABLET | Freq: Every day | ORAL | 3 refills | Status: DC

## 2019-12-14 IMAGING — MR MR PROSTATE WO/W CM
56 series · 56 of 56 positions shown · IV contrast (Multihance 17ml)
Comparison: None.

CLINICAL DATA: % ELEVATED PSA EQUAL 2.6 ON 11/16/2018.  NO BIOPSY.

EXAM:
MR PROSTATE WITHOUT AND WITH CONTRAST
TECHNIQUE: Multiplanar multisequence MRI images were obtained of the pelvis
centered about the prostate. Pre and post contrast images were
obtained.
CONTRAST:  17mL MULTIHANCE GADOBENATE DIMEGLUMINE 529 MG/ML IV SOLN
Creatinine was obtained on site at [HOSPITAL] at [HOSPITAL].
Results: Creatinine 1.0 mg/dL.

[Series 3: T1 · axial · 8.0mm · 1.06mm/px · 1 of 28 slices shown (1 of 2)]
[im 1/28]
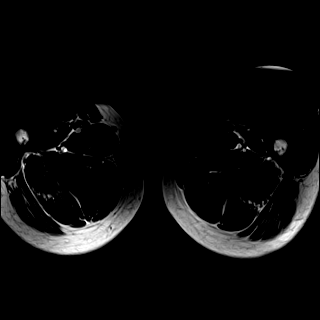

[Series 4: bSSFP fat-sat · axial · 8.0mm · 0.74mm/px · 1 of 28 slices shown]
[im 1/28]
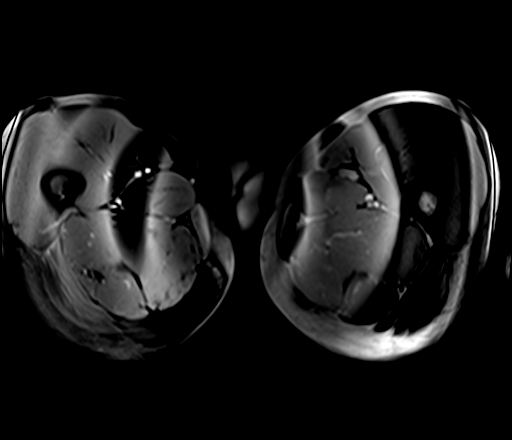

[Series 7: T1 · axial · 3.0mm · 0.31mm/px · 1 of 22 slices shown (2 of 2)]
[im 1/22]
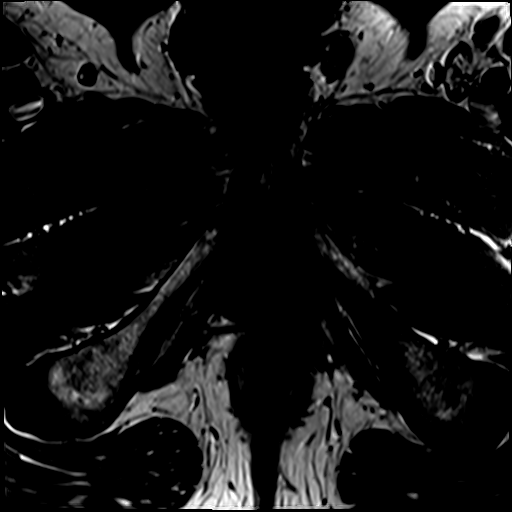

[Series 8: T2 · axial · 3.5mm · 0.56mm/px · 1 of 23 slices shown (1 of 3)]
[im 1/23]
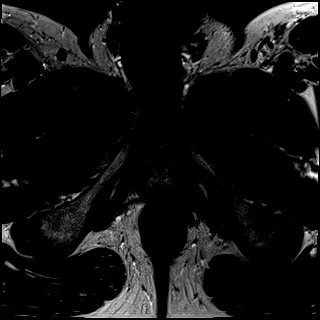

[Series 9: T2 · axial · 1.0mm · 1.04mm/px · 1 of 80 slices shown (2 of 3)]
[im 1/80]
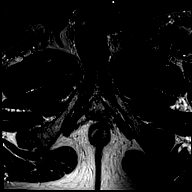

[Series 10: T2 · coronal · 3.5mm · 0.56mm/px · 1 of 23 slices shown (3 of 3)]
[im 1/23]
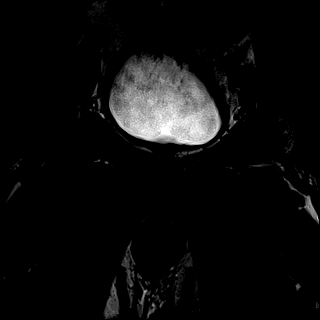

[Series 11: DWI · axial · 3.5mm · 1.56mm/px · 1 of 59 slices shown (1 of 2)]
[im 1/59]
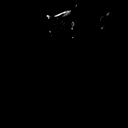

[Series 12: DWI · axial · 3.5mm · 1.56mm/px · 1 of 20 slices shown (2 of 2)]
[im 1/20]
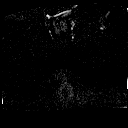

[Series 13: pre t1_twist_tra_dyn_ttc=5.3s · axial · non-contrast · 3.5mm · 0.83mm/px · 1 of 20 slices shown]
[im 1/20]
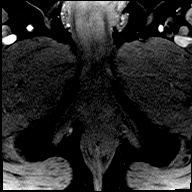

[Series 14: post t1_twist_tra_dyn-copy center · axial · 3.5mm · 0.83mm/px · 1 of 20 slices shown (1 of 24)]
[im 1/20]
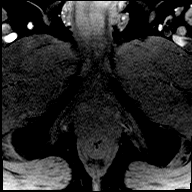

[Series 15: post t1_twist_tra_dyn-copy center · axial · 3.5mm · 0.83mm/px · 1 of 20 slices shown (2 of 24)]
[im 1/20]
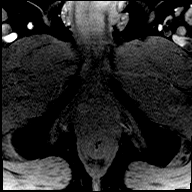

[Series 16: post t1_twist_tra_dyn-copy cent_sub_ttc=(id) · axial · 3.5mm · 0.83mm/px · 1 of 19 slices shown (1 of 23)]
[im 1/19]
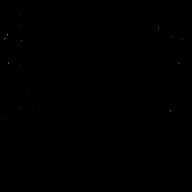

[Series 17: post t1_twist_tra_dyn-copy center · axial · 3.5mm · 0.83mm/px · 1 of 20 slices shown (3 of 24)]
[im 1/20]
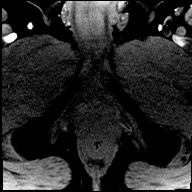

[Series 18: post t1_twist_tra_dyn-copy cent_sub_ttc=(id) · axial · 3.5mm · 0.83mm/px · 1 of 20 slices shown (2 of 23)]
[im 1/20]
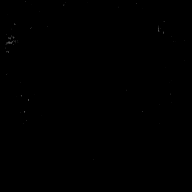

[Series 19: post t1_twist_tra_dyn-copy center · axial · 3.5mm · 0.83mm/px · 1 of 20 slices shown (4 of 24)]
[im 1/20]
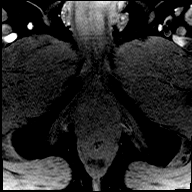

[Series 20: post t1_twist_tra_dyn-copy cent_sub_ttc=(id) · axial · 3.5mm · 0.83mm/px · 1 of 20 slices shown (3 of 23)]
[im 1/20]
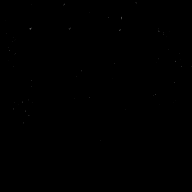

[Series 21: post t1_twist_tra_dyn-copy center · axial · 3.5mm · 0.83mm/px · 1 of 20 slices shown (5 of 24)]
[im 1/20]
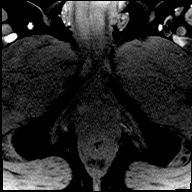

[Series 22: post t1_twist_tra_dyn-copy cent_sub_ttc=(id) · axial · 3.5mm · 0.83mm/px · 1 of 20 slices shown (4 of 23)]
[im 1/20]
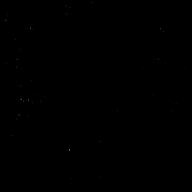

[Series 23: post t1_twist_tra_dyn-copy center · axial · 3.5mm · 0.83mm/px · 1 of 20 slices shown (6 of 24)]
[im 1/20]
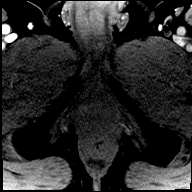

[Series 24: post t1_twist_tra_dyn-copy cent_sub_ttc=(id) · axial · 3.5mm · 0.83mm/px · 1 of 20 slices shown (5 of 23)]
[im 1/20]
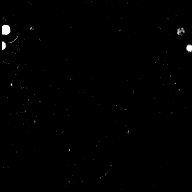

[Series 25: post t1_twist_tra_dyn-copy center · axial · 3.5mm · 0.83mm/px · 1 of 20 slices shown (7 of 24)]
[im 1/20]
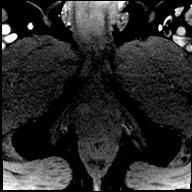

[Series 26: post t1_twist_tra_dyn-copy cent_sub_ttc=(id) · axial · 3.5mm · 0.83mm/px · 1 of 20 slices shown (6 of 23)]
[im 1/20]
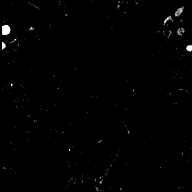

[Series 27: post t1_twist_tra_dyn-copy center · axial · 3.5mm · 0.83mm/px · 1 of 20 slices shown (8 of 24)]
[im 1/20]
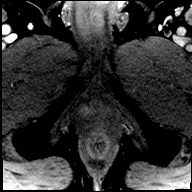

[Series 28: post t1_twist_tra_dyn-copy cent_sub_ttc=(id) · axial · 3.5mm · 0.83mm/px · 1 of 20 slices shown (7 of 23)]
[im 1/20]
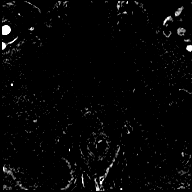

[Series 29: post t1_twist_tra_dyn-copy center · axial · 3.5mm · 0.83mm/px · 1 of 20 slices shown (9 of 24)]
[im 1/20]
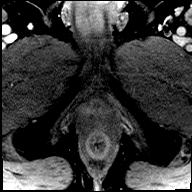

[Series 30: post t1_twist_tra_dyn-copy cent_sub_ttc=(id) · axial · 3.5mm · 0.83mm/px · 1 of 20 slices shown (8 of 23)]
[im 1/20]
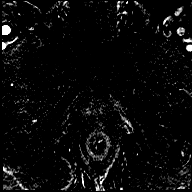

[Series 31: post t1_twist_tra_dyn-copy center · axial · 3.5mm · 0.83mm/px · 1 of 20 slices shown (10 of 24)]
[im 1/20]
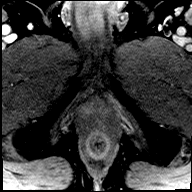

[Series 32: post t1_twist_tra_dyn-copy cent_sub_ttc=(id) · axial · 3.5mm · 0.83mm/px · 1 of 20 slices shown (9 of 23)]
[im 1/20]
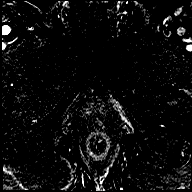

[Series 33: post t1_twist_tra_dyn-copy center · axial · 3.5mm · 0.83mm/px · 1 of 20 slices shown (11 of 24)]
[im 1/20]
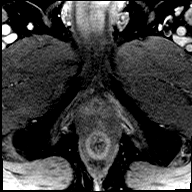

[Series 34: post t1_twist_tra_dyn-copy cent_sub_ttc=(id) · axial · 3.5mm · 0.83mm/px · 1 of 20 slices shown (10 of 23)]
[im 1/20]
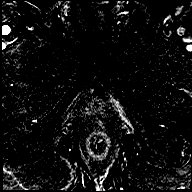

[Series 35: post t1_twist_tra_dyn-copy center · axial · 3.5mm · 0.83mm/px · 1 of 20 slices shown (12 of 24)]
[im 1/20]
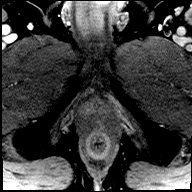

[Series 36: post t1_twist_tra_dyn-copy cent_sub_ttc=(id) · axial · 3.5mm · 0.83mm/px · 1 of 20 slices shown (11 of 23)]
[im 1/20]
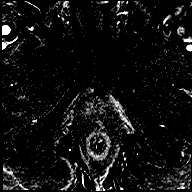

[Series 37: post t1_twist_tra_dyn-copy center · axial · 3.5mm · 0.83mm/px · 1 of 20 slices shown (13 of 24)]
[im 1/20]
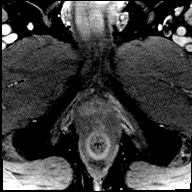

[Series 38: post t1_twist_tra_dyn-copy cent_sub_ttc=(id) · axial · 3.5mm · 0.83mm/px · 1 of 20 slices shown (12 of 23)]
[im 1/20]
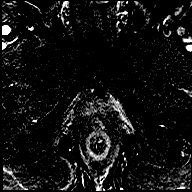

[Series 39: post t1_twist_tra_dyn-copy center · axial · 3.5mm · 0.83mm/px · 1 of 20 slices shown (14 of 24)]
[im 1/20]
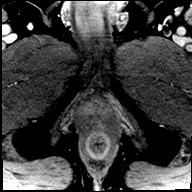

[Series 40: post t1_twist_tra_dyn-copy cent_sub_ttc=(id) · axial · 3.5mm · 0.83mm/px · 1 of 20 slices shown (13 of 23)]
[im 1/20]
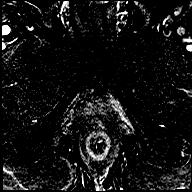

[Series 41: post t1_twist_tra_dyn-copy center · axial · 3.5mm · 0.83mm/px · 1 of 20 slices shown (15 of 24)]
[im 1/20]
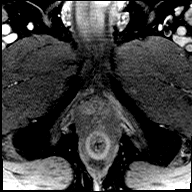

[Series 42: post t1_twist_tra_dyn-copy cent_sub_ttc=(id) · axial · 3.5mm · 0.83mm/px · 1 of 20 slices shown (14 of 23)]
[im 1/20]
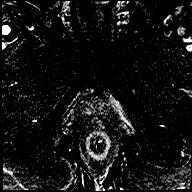

[Series 43: post t1_twist_tra_dyn-copy center · axial · 3.5mm · 0.83mm/px · 1 of 20 slices shown (16 of 24)]
[im 1/20]
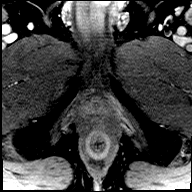

[Series 44: post t1_twist_tra_dyn-copy cent_sub_ttc=(id) · axial · 3.5mm · 0.83mm/px · 1 of 20 slices shown (15 of 23)]
[im 1/20]
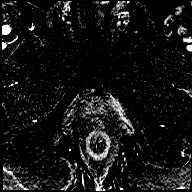

[Series 45: post t1_twist_tra_dyn-copy center · axial · 3.5mm · 0.83mm/px · 1 of 20 slices shown (17 of 24)]
[im 1/20]
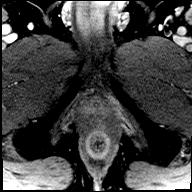

[Series 46: post t1_twist_tra_dyn-copy cent_sub_ttc=(id) · axial · 3.5mm · 0.83mm/px · 1 of 20 slices shown (16 of 23)]
[im 1/20]
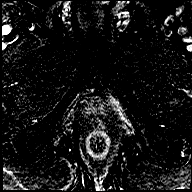

[Series 47: post t1_twist_tra_dyn-copy center · axial · 3.5mm · 0.83mm/px · 1 of 20 slices shown (18 of 24)]
[im 1/20]
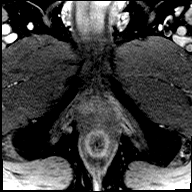

[Series 48: post t1_twist_tra_dyn-copy cent_sub_ttc=(id) · axial · 3.5mm · 0.83mm/px · 1 of 20 slices shown (17 of 23)]
[im 1/20]
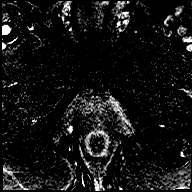

[Series 49: post t1_twist_tra_dyn-copy center · axial · 3.5mm · 0.83mm/px · 1 of 20 slices shown (19 of 24)]
[im 1/20]
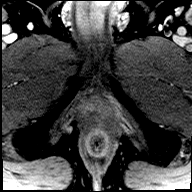

[Series 50: post t1_twist_tra_dyn-copy cent_sub_ttc=(id) · axial · 3.5mm · 0.83mm/px · 1 of 20 slices shown (18 of 23)]
[im 1/20]
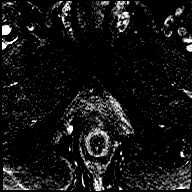

[Series 51: post t1_twist_tra_dyn-copy center · axial · 3.5mm · 0.83mm/px · 1 of 20 slices shown (20 of 24)]
[im 1/20]
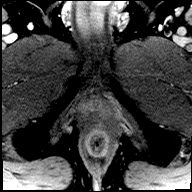

[Series 52: post t1_twist_tra_dyn-copy cent_sub_ttc=(id) · axial · 3.5mm · 0.83mm/px · 1 of 20 slices shown (19 of 23)]
[im 1/20]
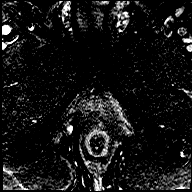

[Series 53: post t1_twist_tra_dyn-copy center · axial · 3.5mm · 0.83mm/px · 1 of 20 slices shown (21 of 24)]
[im 1/20]
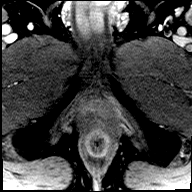

[Series 54: post t1_twist_tra_dyn-copy cent_sub_ttc=(id) · axial · 3.5mm · 0.83mm/px · 1 of 20 slices shown (20 of 23)]
[im 1/20]
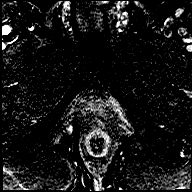

[Series 55: post t1_twist_tra_dyn-copy center · axial · 3.5mm · 0.83mm/px · 1 of 20 slices shown (22 of 24)]
[im 1/20]
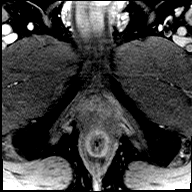

[Series 56: post t1_twist_tra_dyn-copy cent_sub_ttc=(id) · axial · 3.5mm · 0.83mm/px · 1 of 20 slices shown (21 of 23)]
[im 1/20]
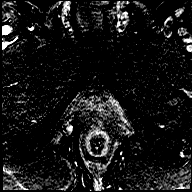

[Series 57: post t1_twist_tra_dyn-copy center · axial · 3.5mm · 0.83mm/px · 1 of 20 slices shown (23 of 24)]
[im 1/20]
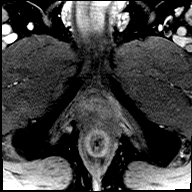

[Series 58: post t1_twist_tra_dyn-copy cent_sub_ttc=(id) · axial · 3.5mm · 0.83mm/px · 1 of 20 slices shown (22 of 23)]
[im 1/20]
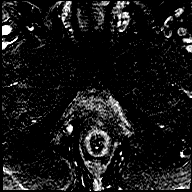

[Series 59: post t1_twist_tra_dyn-copy center · axial · 3.5mm · 0.83mm/px · 1 of 20 slices shown (24 of 24)]
[im 1/20]
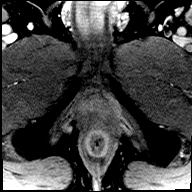

[Series 60: post t1_twist_tra_dyn-copy cent_sub_ttc=(id) · axial · 3.5mm · 0.83mm/px · 1 of 20 slices shown (23 of 23)]
[im 1/20]
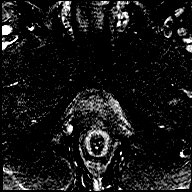

[56 of 56 positions shown; findings below may reference images not displayed]

FINDINGS: Prostate: No foci of restricted diffusion in the peripheral zone.
The peripheral zone is homogeneous low signal intensity on T2
weighted imaging without focal lesion. Typically the peripheral zone
is intermediate to high signal intensity on T2 weighted imaging.
There is low-density stromal tissue the base and apex. No
enhancement of this tissue. Prostatic capsule is intact. Seminal
vesicles normal.

Volume: 4.6 x 4.2 x 3.8 cm (volume = 38)

Transcapsular spread:  Absent

Seminal vesicle involvement: Absent

Neurovascular bundle involvement: Absent

Pelvic adenopathy: Absent

Bone metastasis: Absent

Other findings: None
IMPRESSION: 1. No evidence of high-grade carcinoma in the peripheral zone.
2. Mild prostate enlargement.

## 2020-02-17 ENCOUNTER — Other Ambulatory Visit: Payer: Self-pay | Admitting: Family Medicine

## 2020-02-17 DIAGNOSIS — Z87898 Personal history of other specified conditions: Secondary | ICD-10-CM

## 2020-02-21 ENCOUNTER — Ambulatory Visit: Payer: Federal, State, Local not specified - PPO | Admitting: Urology

## 2020-02-28 ENCOUNTER — Other Ambulatory Visit: Payer: Self-pay

## 2020-02-28 ENCOUNTER — Ambulatory Visit: Payer: Federal, State, Local not specified - PPO | Admitting: Urology

## 2020-02-28 ENCOUNTER — Other Ambulatory Visit
Admission: RE | Admit: 2020-02-28 | Discharge: 2020-02-28 | Disposition: A | Discharge status: Home / Self Care | Payer: Federal, State, Local not specified - PPO | Attending: Urology | Admitting: Urology

## 2020-02-28 DIAGNOSIS — Z87898 Personal history of other specified conditions: Secondary | ICD-10-CM | POA: Insufficient documentation

## 2020-02-28 LAB — PSA: Prostatic Specific Antigen: 1.47 ng/mL (ref 0.00–4.00)

## 2020-03-06 ENCOUNTER — Ambulatory Visit: Payer: Federal, State, Local not specified - PPO | Admitting: Urology

## 2020-03-13 ENCOUNTER — Ambulatory Visit: Payer: Federal, State, Local not specified - PPO | Admitting: Urology

## 2020-03-20 ENCOUNTER — Telehealth: Payer: Self-pay | Admitting: Urology

## 2020-03-20 NOTE — Telephone Encounter (Signed)
Would you call Joseph Ho and have him reschedule his missed appointment?

## 2020-03-22 NOTE — Telephone Encounter (Signed)
Patient aware of appointment made

## 2020-04-03 ENCOUNTER — Ambulatory Visit: Payer: Federal, State, Local not specified - PPO | Admitting: Urology

## 2020-04-03 ENCOUNTER — Other Ambulatory Visit: Payer: Self-pay

## 2020-04-03 ENCOUNTER — Other Ambulatory Visit
Admission: RE | Admit: 2020-04-03 | Discharge: 2020-04-03 | Disposition: A | Discharge status: Home / Self Care | Payer: Federal, State, Local not specified - PPO | Attending: Urology | Admitting: Urology

## 2020-04-03 ENCOUNTER — Encounter: Payer: Self-pay | Admitting: Urology

## 2020-04-03 VITALS — BP 133/87 | HR 65 | Ht 69.0 in | Wt 189.0 lb

## 2020-04-03 DIAGNOSIS — N529 Male erectile dysfunction, unspecified: Secondary | ICD-10-CM

## 2020-04-03 DIAGNOSIS — N401 Enlarged prostate with lower urinary tract symptoms: Secondary | ICD-10-CM | POA: Diagnosis not present

## 2020-04-03 DIAGNOSIS — N138 Other obstructive and reflux uropathy: Secondary | ICD-10-CM

## 2020-04-03 DIAGNOSIS — Z87898 Personal history of other specified conditions: Secondary | ICD-10-CM

## 2020-04-03 MED ORDER — SILDENAFIL CITRATE 100 MG PO TABS: 100.0000 mg | ORAL_TABLET | Freq: Every day | ORAL | 3 refills | Status: DC | PRN

## 2020-04-03 NOTE — Progress Notes (Signed)
04/03/2020 10:28 AM   Joseph Ho 09/23/1966 [id number]  Referring provider: Jerl Mina, MD [address] [facility] [place],  [place] [address]  Chief Complaint  Patient presents with  . Elevated PSA    HPI: Patient is a 54 year old male with a history of elevated PSA, BPH with LU TS and ED who presents today for a six months follow up.    History of elevated PSA PSA trend has been:  2.92 ng/mL on 08/07/2015  3.55 ng/mL on 07/30/2016  4.87 ng/mL on 08/06/2017  4.5 ng/mL in 08/2017 - PSA, free 0.75 - PSA, Free Pct 16.7% - 17% probability  3.6 ng/mL in 12/2017  4.5 ng/mL in 02/2018  4.0 ng/mL in 05/2018  3.0 ng/mL in 07/2018 - started finasteride   2.6 (5.2) ng/mL in 08/2018  2.7 (5.4) ng/mL in 10/2018  2.6 (5.2) ng/mL in 11/2018 - prostate MRI no evidence of high-grade carcinoma in the peripheral zone.  Mild prostate enlargement.  PSAD 0.108.  PSAD less than 0.15 ng/mL/mL was the most well studied and accurate negative predictive factor for clinically significant prostate cancer diagnosis.  Recommend the use of PSAD less than 0.15 ng/mL/mL along with negative MRI results to omit biopsy indication in cancer naive patients.  Predictive Factors of Missed Clinically Significant Prostate Cancers in Men with Negative Magnetic Resonance Imaging: A Systematic Review and Meta-Analysis.  Journal of Urology: Vol. 204, 24-32 July 2020.   2.07 (4.14) ng/mL in 08/2019  1.47 (2.94) ng/mL in 02/2020  BPH WITH LUTS  (prostate and/or bladder) His IPSS score today 3, which is mild lower urinary tract symptomatology.  He is pleased with his quality life due to his urinary symptoms.  His previous I PSS score was 1/0.  He denies any dysuria, hematuria or suprapubic pain.   He also denies any recent fevers, chills, nausea or vomiting.  His father had fatal prostate cancer and passed away at age 66.   IPSS    Row Name 04/03/20 1000         International Prostate Symptom Score   How often have you had the sensation of not emptying your bladder?  Not at All     How often have you had to urinate less than every two hours?  Not at All     How often have you found you stopped and started again several times when you urinated?  Less than 1 in 5 times     How often have you found it difficult to postpone urination?  Not at All     How often have you had a weak urinary stream?  Less than 1 in 5 times     How often have you had to strain to start urination?  Not at All     How many times did you typically get up at night to urinate?  1 Time     Total IPSS Score  3       Quality of Life due to urinary symptoms   If you were to spend the rest of your life with your urinary condition just the way it is now how would you feel about that?  Delighted        Score:  1-7 Mild 8-19 Moderate 20-35 Severe    Erectile dysfunction SHIM score: 9     Main complaint: Achieving and maintaining an erection at the right time x couple years Risk factors:  age, BPH, and HTN No  painful erections or curvatures with his erections.    Still having spontaneous erections. Tried:    Cialis and Viagra, better results with Viagra - but delayed  SHIM    Row Name 04/03/20 1013         SHIM: Over the last 6 months:   How do you rate your confidence that you could get and keep an erection?  Very Low     When you had erections with sexual stimulation, how often were your erections hard enough for penetration (entering your partner)?  A Few Times (much less than half the time)     During sexual intercourse, how often were you able to maintain your erection after you had penetrated (entered) your partner?  A Few Times (much less than half the time)     During sexual intercourse, how difficult was it to maintain your erection to completion of intercourse?  Very Difficult     When you attempted sexual intercourse, how often was it satisfactory for you?  A Few Times (much less than half the time)        SHIM Total Score   SHIM  9        Score: 1-7 Severe ED 8-11 Moderate ED 12-16 Mild-Moderate ED 17-21 Mild ED 22-25 No ED  PMH: Past Medical History:  Diagnosis Date  . Hypertension     Surgical History: History reviewed. No pertinent surgical history.  Home Medications:  Allergies as of 04/03/2020   No Known Allergies     Medication List       Accurate as of April 03, 2020 10:28 AM. If you have any questions, ask your nurse or doctor.        finasteride 5 MG tablet Commonly known as: Proscar Take 1 tablet (5 mg total) by mouth daily.   lisinopril-hydrochlorothiazide 20-12.5 MG tablet Commonly known as: ZESTORETIC Take by mouth.   sildenafil 100 MG tablet Commonly known as: VIAGRA Take 1 tablet (100 mg total) by mouth daily as needed for erectile dysfunction. Take two hours prior to intercourse on an empty stomach   tadalafil 20 MG tablet Commonly known as: Cialis Take 1 tablet (20 mg total) by mouth daily as needed for erectile dysfunction.       Allergies: No Known Allergies  Family History: Family History  Problem Relation Age of Onset  . Prostate cancer Father   . Bladder Cancer Neg Hx   . Kidney cancer Neg Hx     Social History:  reports that he has never smoked. He has never used smokeless tobacco. He reports that he does not drink alcohol or use drugs.  ROS: For pertinent review of systems please refer to history of present illness  Physical Exam: BP 133/87   Pulse 65   Ht 5\' 9"  (1.753 m)   Wt 189 lb (85.7 kg)   BMI 27.91 kg/m   Constitutional:  Well nourished. Alert and oriented, No acute distress. HEENT: [place] AT, mask in place.  Trachea midline. Cardiovascular: No clubbing, cyanosis, or edema. Respiratory: Normal respiratory effort, no increased work of breathing. GI: Abdomen is soft, non tender, non distended, no abdominal masses.  GU: No CVA tenderness.  No bladder fullness or masses.  Patient with uncircumcised phallus.   Foreskin easily retracted.  Urethral meatus is patent.  No penile discharge. No penile lesions or rashes. Scrotum without lesions, cysts, rashes and/or edema.  Testicles are located scrotally bilaterally. No masses are appreciated in the testicles. Left and right  epididymis are normal. Rectal: Patient with  normal sphincter tone. Anus and perineum without scarring or rashes. No rectal masses are appreciated. Prostate is approximately 50 grams, no nodules are appreciated. Left seminal vesicle is  Normal, could not palpate the right Skin: No rashes, bruises or suspicious lesions. Lymph: No inguinal adenopathy. Neurologic: Grossly intact, no focal deficits, moving all 4 extremities. Psychiatric: Normal mood and affect.   Laboratory Data: See PSA in HPI  I have reviewed the labs.   Assessment & Plan:    1. History of elevated PSA PSA continues a downward trend Continue finasteride RTC in 6 months for PSA and exam  2. BPH with LUTS IPSS score is 3/0, it is slightly worse Continue conservative management, avoiding bladder irritants and timed voiding's Continue finasteride  RTC in 6 months for I PSS, PSA and exam - if PSA continues to reduce  3. ED Viagra is effective, but it has a delayed reaction Will check a testosterone level today RTC in 6 months for SHIM and exam   Return in about 6 months (around 10/03/2020) for IPSS, SHIM, PSA and exam.  These notes generated with voice recognition software. I apologize for typographical errors.  Zara Council, PA-C  [facility] [address] [place], [place] [address] [phone]

## 2020-04-04 LAB — TESTOSTERONE: Testosterone: 417 ng/dL (ref 264–916)

## 2020-05-21 ENCOUNTER — Other Ambulatory Visit: Payer: Self-pay | Admitting: Urology

## 2020-10-06 ENCOUNTER — Other Ambulatory Visit: Payer: Self-pay

## 2020-10-06 ENCOUNTER — Other Ambulatory Visit
Admission: RE | Admit: 2020-10-06 | Discharge: 2020-10-06 | Disposition: A | Discharge status: Home / Self Care | Payer: Federal, State, Local not specified - PPO | Attending: Urology | Admitting: Urology

## 2020-10-06 DIAGNOSIS — R972 Elevated prostate specific antigen [PSA]: Secondary | ICD-10-CM | POA: Diagnosis not present

## 2020-10-06 LAB — PSA: Prostatic Specific Antigen: 1.76 ng/mL (ref 0.00–4.00)

## 2020-10-07 NOTE — Progress Notes (Signed)
10/09/2020 9:00 AM   Joseph Ho [birth year] [id number]  Referring provider: Maryland Pink, MD [address] [facility] [place],  [place] [address]  Chief Complaint  Patient presents with  . Benign Prostatic Hypertrophy    53mofollow up    HPI: Patient is a 54year old male with a history of elevated PSA, BPH with LU TS and ED who presents today for a six months follow up.    History of elevated PSA PSA trend has been:  2.92 ng/mL on 08/07/2015  3.55 ng/mL on 07/30/2016  4.87 ng/mL on 08/06/2017  4.5 ng/mL in 08/2017 - PSA, free 0.75 - PSA, Free Pct 16.7% - 17% probability  3.6 ng/mL in 12/2017  4.5 ng/mL in 02/2018  4.0 ng/mL in 05/2018  3.0 ng/mL in 07/2018 - started finasteride   2.6 (5.2) ng/mL in 08/2018  2.7 (5.4) ng/mL in 10/2018  2.6 (5.2) ng/mL in 11/2018 - prostate MRI no evidence of high-grade carcinoma in the peripheral zone.  Mild prostate enlargement.  PSAD 0.108.  PSAD less than 0.15 ng/mL/mL was the most well studied and accurate negative predictive factor for clinically significant prostate cancer diagnosis.  Recommend the use of PSAD less than 0.15 ng/mL/mL along with negative MRI results to omit biopsy indication in cancer naive patients.  Predictive Factors of Missed Clinically Significant Prostate Cancers in Men with Negative Magnetic Resonance Imaging: A Systematic Review and Meta-Analysis.  Journal of Urology: Vol. 204, 24-32 July 2020.   2.07 (4.14) ng/mL in 08/2019  1.47 (2.94) ng/mL in 02/2020  1.76 (3.52) ng/mL in 09/2020  BPH WITH LUTS  (prostate and/or bladder) His IPSS score today mild, which is mild lower urinary tract symptomatology.  He is delighted with his quality life due to his urinary symptoms.  His previous I PSS score was 3/1.  He denies any dysuria, hematuria or suprapubic pain.   He also denies any recent fevers, chills, nausea or vomiting.  He states that he is not certain what type of cancer his father had passed from, but  he will call his sister and confirm.    IPSS    Row Name 10/09/20 0800         International Prostate Symptom Score   How often have you had the sensation of not emptying your bladder? Not at All     How often have you had to urinate less than every two hours? Not at All     How often have you found you stopped and started again several times when you urinated? Not at All     How often have you found it difficult to postpone urination? Not at All     How often have you had a weak urinary stream? Not at All     How often have you had to strain to start urination? Not at All     How many times did you typically get up at night to urinate? 1 Time     Total IPSS Score 1       Quality of Life due to urinary symptoms   If you were to spend the rest of your life with your urinary condition just the way it is now how would you feel about that? Delighted            Score:  1-7 Mild 8-19 Moderate 20-35 Severe    Erectile dysfunction SHIM: 17      Previous SHIM score: 9  Main complaint: Achieving and maintaining an erection at the right time x couple years Risk factors:  age, BPH, and HTN No painful erections or curvatures with his erections.    Still having spontaneous erections. Tried:    Cialis and Viagra, better results with Viagra - but delayed   SHIM    Row Name 10/09/20 0847         SHIM: Over the last 6 months:   How do you rate your confidence that you could get and keep an erection? Very Low     When you had erections with sexual stimulation, how often were your erections hard enough for penetration (entering your partner)? Most Times (much more than half the time)     During sexual intercourse, how often were you able to maintain your erection after you had penetrated (entered) your partner? Most Times (much more than half the time)     During sexual intercourse, how difficult was it to maintain your erection to completion of intercourse? Slightly Difficult     When  you attempted sexual intercourse, how often was it satisfactory for you? Most Times (much more than half the time)       SHIM Total Score   SHIM 17            Score: 1-7 Severe ED 8-11 Moderate ED 12-16 Mild-Moderate ED 17-21 Mild ED 22-25 No ED  PMH: Past Medical History:  Diagnosis Date  . Hypertension     Surgical History: No past surgical history on file.  Home Medications:  Allergies as of 10/09/2020   No Known Allergies     Medication List       Accurate as of October 09, 2020  9:00 AM. If you have any questions, ask your nurse or doctor.        finasteride 5 MG tablet Commonly known as: PROSCAR TAKE 1 TABLET(5 MG) BY MOUTH DAILY   lisinopril-hydrochlorothiazide 20-12.5 MG tablet Commonly known as: ZESTORETIC Take by mouth.   sildenafil 100 MG tablet Commonly known as: VIAGRA Take 1 tablet (100 mg total) by mouth daily as needed for erectile dysfunction. Take two hours prior to intercourse on an empty stomach   tadalafil 20 MG tablet Commonly known as: Cialis Take 1 tablet (20 mg total) by mouth daily as needed for erectile dysfunction.       Allergies: No Known Allergies  Family History: Family History  Problem Relation Age of Onset  . Prostate cancer Father   . Bladder Cancer Neg Hx   . Kidney cancer Neg Hx     Social History:  reports that he has never smoked. He has never used smokeless tobacco. He reports that he does not drink alcohol and does not use drugs.  ROS: For pertinent review of systems please refer to history of present illness  Physical Exam: BP 137/77   Pulse 80   Ht 5' 9" (1.753 m)   Wt 175 lb (79.4 kg)   BMI 25.84 kg/m   Constitutional:  Well nourished. Alert and oriented, No acute distress. HEENT:  AT, mask in place.  Trachea midline Cardiovascular: No clubbing, cyanosis, or edema. Respiratory: Normal respiratory effort, no increased work of breathing. GU: No CVA tenderness.  No bladder fullness or masses.   Patient with uncircumcised phallus.  Foreskin easily retracted  Urethral meatus is patent.  No penile discharge. No penile lesions or rashes. Scrotum without lesions, cysts, rashes and/or edema.  Testicles are located scrotally bilaterally. No masses are  appreciated in the testicles. Left and right epididymis are normal. Rectal: Patient with  normal sphincter tone. Anus and perineum without scarring or rashes. No rectal masses are appreciated. Prostate is approximately 55 grams, no nodules are appreciated. Seminal vesicles are normal. Skin: No rashes, bruises or suspicious lesions. Lymph: No inguinal adenopathy. Neurologic: Grossly intact, no focal deficits, moving all 4 extremities. Psychiatric: Normal mood and affect.   Laboratory Data: See PSA in HPI Specimen:  Urine  Ref Range & Units 3 wk ago  Color Yellow  Yellow      Clarity Clear  Clear      Specific Gravity 1.000 - 1.030  1.025      pH, Urine 5.0 - 8.0  5.5      Protein, Urinalysis Negative, Trace mg/dL Negative      Glucose, Urinalysis Negative mg/dL Negative      Ketones, Urinalysis Negative mg/dL Negative      Blood, Urinalysis Negative  Negative      Nitrite, Urinalysis Negative  Negative      Leukocyte Esterase, Urinalysis Negative  Negative      White Blood Cells, Urinalysis None Seen, 0-3 /hpf None Seen      Red Blood Cells, Urinalysis None Seen, 0-3 /hpf None Seen      Bacteria, Urinalysis None Seen /hpf None Seen      Squamous Epithelial Cells, Urinalysis Rare, Few, None Seen /hpf None Seen      Resulting Agency  [facility]  Specimen Collected: 09/13/20 2:02 PM Last Resulted: 09/13/20 2:20 PM  Received From: [facility]  Result Received: 09/27/20 2:00 PM   Specimen:  Blood  Ref Range & Units 3 wk ago  Glucose 70 - 110 mg/dL 79      Sodium 136 - 145 mmol/L 140      Potassium 3.6 - 5.1 mmol/L 3.9      Chloride 97 - 109 mmol/L 104      Carbon Dioxide (CO2) 22.0  - 32.0 mmol/L 29.1      Urea Nitrogen (BUN) 7 - 25 mg/dL 18      Creatinine 0.7 - 1.3 mg/dL 1.1      Glomerular Filtration Rate (eGFR), MDRD Estimate >60 mL/min/1.73sq m 85      Calcium 8.7 - 10.3 mg/dL 9.7      AST  8 - 39 U/L 26      ALT  6 - 57 U/L 29      Alk Phos (alkaline Phosphatase) 34 - 104 U/L 90      Albumin 3.5 - 4.8 g/dL 4.3      Bilirubin, Total 0.3 - 1.2 mg/dL 0.4      Protein, Total 6.1 - 7.9 g/dL 6.8      A/G Ratio 1.0 - 5.0 gm/dL 1.7      Resulting Agency  [facility]  Specimen Collected: 09/13/20 2:03 PM Last Resulted: 09/13/20 3:51 PM  Received From: [facility]  Result Received: 09/27/20 2:00 PM    I have reviewed the labs.   Assessment & Plan:    1. History of elevated PSA Continue finasteride RTC in 6 months for PSA and exam  2. BPH with LUTS IPSS score is 1/0, it is improved Continue conservative management, avoiding bladder irritants and timed voiding's Continue finasteride  RTC in 6 months for I PSS, PSA and exam   3. ED SHIM score 17  Viagra is effective, but it has  a delayed reaction RTC in 6 months for SHIM and exam   Return in about 6 months (around 04/08/2021) for IPSS, SHIM, PSA and exam.  These notes generated with voice recognition software. I apologize for typographical errors.  Zara Council, PA-C  [facility] [address] [place], [place] [address] [phone]

## 2020-10-09 ENCOUNTER — Ambulatory Visit: Payer: Federal, State, Local not specified - PPO | Admitting: Urology

## 2020-10-09 ENCOUNTER — Other Ambulatory Visit: Payer: Self-pay

## 2020-10-09 ENCOUNTER — Encounter: Payer: Self-pay | Admitting: Urology

## 2020-10-09 VITALS — BP 137/77 | HR 80 | Ht 69.0 in | Wt 175.0 lb

## 2020-10-09 DIAGNOSIS — Z87898 Personal history of other specified conditions: Secondary | ICD-10-CM | POA: Diagnosis not present

## 2020-10-09 DIAGNOSIS — N138 Other obstructive and reflux uropathy: Secondary | ICD-10-CM | POA: Diagnosis not present

## 2020-10-09 DIAGNOSIS — N529 Male erectile dysfunction, unspecified: Secondary | ICD-10-CM

## 2020-10-09 DIAGNOSIS — N401 Enlarged prostate with lower urinary tract symptoms: Secondary | ICD-10-CM | POA: Diagnosis not present

## 2020-10-11 ENCOUNTER — Ambulatory Visit: Payer: Federal, State, Local not specified - PPO

## 2020-10-18 ENCOUNTER — Ambulatory Visit: Payer: Federal, State, Local not specified - PPO | Attending: Family Medicine

## 2020-12-29 ENCOUNTER — Ambulatory Visit: Payer: Federal, State, Local not specified - PPO

## 2021-01-05 ENCOUNTER — Ambulatory Visit: Payer: Federal, State, Local not specified - PPO

## 2021-01-26 ENCOUNTER — Other Ambulatory Visit: Payer: Self-pay

## 2021-01-26 ENCOUNTER — Ambulatory Visit: Payer: Federal, State, Local not specified - PPO | Attending: Family Medicine

## 2021-01-26 DIAGNOSIS — R42 Dizziness and giddiness: Secondary | ICD-10-CM | POA: Insufficient documentation

## 2021-01-26 NOTE — Patient Instructions (Addendum)
Access Code: [email or web address] URL: [email or web address] Date: 01/26/2021 Prepared by: Ria Comment  Exercises Self-Epley Maneuver Left Ear - 1 x daily - 7 x weekly - 3 reps - 60s holds in each position hold  Patient Education [facility]PP[facility]

## 2021-01-26 NOTE — Therapy (Signed)
[place] [facility] [address] [place], [place], [address] Phone: [phone]   Fax:  [phone]  Physical Therapy Evaluation  Patient Details  Name: Joseph Edelmanercy Koenigsberg MRN: [id number] Date of Birth: 03-Jul-1966 Referring Provider (PT): Dr. Burnett ShengHedrick   Encounter Date: 01/26/2021   PT End of Session - 01/26/21 0855    Visit Number 1    Number of Visits 5    Date for PT Re-Evaluation 02/23/21    Authorization Type eval: 01/26/21    PT Start Time 0802    PT Stop Time 0847    PT Time Calculation (min) 45 min    Activity Tolerance Patient tolerated treatment well    Behavior During Therapy [facility] for tasks assessed/performed           Past Medical History:  Diagnosis Date  . Hypertension     History reviewed. No pertinent surgical history.  There were no vitals filed for this visit.    Subjective Assessment - 01/26/21 0852    Subjective Dizziness    Pertinent History Pt reports that approximately 9 months ago he sat up in bed and "felt something pop." He states that after that when he would roll in bed or try to tie his shoes he would get dizzy. His symptoms last for 10-15 seconds and happen daily. Symptoms have been unchanged since onset. He has a history of PTSD. Has tried meclizine without benefit. Reports bilateral feet numbness for multiple years due to cold exposure and near frostbite years ago. Otherwise he denies any recent health changes.    Diagnostic tests None    Patient Stated Goals Improve dizziness    Currently in Pain? Other (Comment)   Unrelated to current episode             [facility] PT Assessment - 01/26/21 0853      Assessment   Medical Diagnosis BPPV    Referring Provider (PT) Dr. Burnett ShengHedrick    Onset Date/Surgical Date 04/25/20    Hand Dominance Left    Next MD Visit Not reported    Prior Therapy None for this issue      Precautions   Precautions None      Restrictions   Weight Bearing Restrictions No       Balance Screen   Has the patient fallen in the past 6 months No    Has the patient had a decrease in activity level because of a fear of falling?  No    Is the patient reluctant to leave their home because of a fear of falling?  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 His symptoms last for 10-15 seconds and happen daily. Symptoms have been unchanged since  onset. He has a history of PTSD. Has tried meclizine without benefit. Reports bilateral feet numbness for multiple years due to cold exposure and near frostbite years ago. Otherwise he denies any recent health changes.    Description of dizziness: (vertigo, unsteadiness, lightheadedness, falling, general unsteadiness, whoozy, swimmy-headed sensation, aural fullness) swaying and unsteadiness, initially presented with vertigo Frequency: multiple times/day Duration: 10-15s Symptom nature: (motion provoked, positional, spontaneous, constant, variable, intermittent)   Provocative Factors: rolling in bed, bending forward, sitting up quickly, occasionally walking up hill Easing Factors: Waiting for symptoms to pass  Progression of symptoms: (better, worse, no change since onset) no change History of similar episodes: none  Falls (yes/no): No  Number of falls in past 6 months:   Prior Functional Level: Independent  with ADLs/IADLs, 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    EXAMINATION  POSTURE: WNL  NEUROLOGICAL SCREEN: (2+ unless otherwise noted.) N=normal  Ab=abnormal  Level Dermatome R L Myotome R L Reflex R L  C3 Anterior Neck N N Sidebend C2-3 N N Jaw CN V    C4 Top of Shoulder N N Shoulder Shrug C4 N N Hoffman's UMN    C5 Lateral Upper Arm N N Shoulder ABD C4-5 N N Biceps C5-6    C6 Lateral Arm/ Thumb N N Arm Flex/ Wrist Ext C5-6 N N Brachiorad. C5-6    C7 Middle Finger N N Arm Ext//Wrist Flex C6-7 N N Triceps C7    C8 4th & 5th Finger N N Flex/ Ext Carpi Ulnaris C8 N N Patellar (L3-4)    T1 Medial Arm N N Interossei T1 N N Gastrocnemius    L2 Medial thigh/groin N N Illiopsoas (L2-3) N N     L3 Lower thigh/med.knee N N Quadriceps (L3-4) N N     L4 Medial leg/lat thigh N N Tibialis Ant (L4-5) N N     L5 Lat. leg & dorsal foot N N EHL (L5) N N     S1 post/lat foot/thigh/leg N N Gastrocnemius (S1-2) N N     S2 Post./med. thigh & leg N N Hamstrings (L4-S3) N N       Cranial Nerves Visual acuity and visual fields are intact  Extraocular muscles are intact  Facial sensation is intact bilaterally  Facial strength is intact bilaterally  Hearing is normal as tested by gross conversation Palate elevates midline, normal phonation  Shoulder shrug strength is intact  Tongue protrudes midline     COORDINATION: Finger to Nose: Normal Heel to Shin: Normal Pronator  Drift: Negative Rapid Alternating Movements: Normal Finger to Thumb Opposition: Normal  MUSCULOSKELETAL SCREEN: Cervical Spine ROM: WFL and painless in all planes. No gross deficits identified   ROM: WNL  MMT: WNL  Functional Mobility: WNL, independent without assistive device     POSTURAL CONTROL TESTS:   Clinical Test of Sensory Interaction for Balance    (CTSIB): Deferred   OCULOMOTOR / VESTIBULAR TESTING:  Oculomotor Exam- Room Light  Findings Comments  Ocular Alignment normal   Ocular ROM normal  Spontaneous Nystagmus normal   Gaze-Holding Nystagmus normal   End-Gaze Nystagmus normal   Vergence (normal 2-3") not examined   Smooth Pursuit normal   Cross-Cover Test not examined   Saccades abnormal Consistently hypometric  VOR Cancellation normal   Left Head Impulse normal   Right Head Impulse normal   Static Acuity not examined   Dynamic Acuity not examined     Oculomotor Exam- Fixation Suppressed: Deferred BPPV TESTS:  Symptoms Duration Intensity Nystagmus  L Dix-Hallpike Vertigo/dizziness 10s 8/10 Upbeating L torsional nystagmus  R Dix-Hallpike None   None  L Head Roll None   None  R Head Roll None   None  L Sidelying Test      R Sidelying Test        FUNCTIONAL OUTCOME MEASURES   Results Comments  BERG NT   DGI NT   FGA NT   FOTO 39 Predicted improvement to 55  ABC Scale 40% Low balance confidence  DHI 32/100 Mild self-reported dizziness                            Objective measurements completed on examination: See above findings.       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 One minute holds in each position and retesting between maneuvers. After first maneuver and with each retest pt reports no further vertigo and has no nystagmus. Pt issued HEP for home Epley maneuver with education about how to perform.        PT Education - 01/26/21  0855    Education Details BPPV, Plan of care, HEP    Person(s) Educated Patient    Methods Explanation;Demonstration;Handout    Comprehension Verbalized understanding            PT Short Term Goals - 01/26/21 0859      PT SHORT TERM GOAL #1   Title Pt will be independent with home Epley maneuver to treat BPPV and resolve symptoms at home and work    Time 2    Period Weeks    Status New    Target Date 02/09/21             PT Long Term Goals - 01/26/21 0911      PT LONG TERM GOAL #1   Title Pt will improve FOTO to at least  in order to demonstrate clinically significant improvement in function related to dizziness    Baseline 01/26/21: 39    Time 4    Period Weeks    Status New    Target Date 02/23/21      PT LONG TERM GOAL #2   Title Pt will improve ABC by at least 13% in order to demonstrate clinically significant improvement in balance confidence.    Baseline 01/26/21: 40%    Time 4    Period Weeks    Status New    Target Date 02/23/21      PT LONG TERM GOAL #3   Title Pt will decrease DHI score by at least 18 points in order to demonstrate clinically significant reduction in disability    Baseline 01/26/21: 32/100    Time 4    Period Weeks    Status New    Target Date 02/23/21      PT LONG TERM GOAL #4   Title Pt will report no further episodes of dizziness when rolling in bed  or bending forward to tie shoes in order to resume full function without symptoms    Time 4    Period Weeks    Status New    Target Date 02/23/21                  Plan - 01/26/21 0835    Clinical Impression Statement Pt is a pleasant 55 year-old male referred for BPPV. PT examination reveals positive L Dix-Hallpike test for upbeating L torsional nystagmus with concurrent vertigo. 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 Pt treated with 3 bouts of Epley maneuver during initial evaluation with full resolution of his symptoms. Pt educated about how to perform Epley  maneuver at home. Will retest at follow-up session, treat as needed, and discharge once symptoms are fully resolved. Pt will benefit from skilled PT services to address dizziness and improve symptom-free function at home.    Personal Factors and Comorbidities Comorbidity 2;Time since onset of injury/illness/exacerbation    Comorbidities HTN, migraines    Examination-Activity Limitations Bed Mobility;Transfers;Bend    Examination-Participation Restrictions Community Activity;Occupation    Stability/Clinical Decision Making Stable/Uncomplicated    Clinical Decision Making Low    Rehab Potential Excellent    PT Frequency 1x / week    PT Duration 4 weeks    PT Treatment/Interventions ADLs/Self Care Home Management;Aquatic Therapy;Biofeedback;Canalith Repostioning;Cryotherapy;Electrical Stimulation;Iontophoresis 4mg /ml Dexamethasone;Moist Heat;Traction;Ultrasound;DME Instruction;Gait training;Stair training;Functional mobility training;Therapeutic activities;Therapeutic exercise;Balance training;Neuromuscular re-education;Cognitive remediation;Patient/family education;Manual techniques;Passive range of motion;Dry needling;Vestibular;Spinal Manipulations;Joint Manipulations    PT Next Visit Plan Recheck Dix-Hallpike and treat with Epley as needed    PT Home Exercise Plan Access Code:    Consulted and Agree with Plan of Care Patient           Patient will benefit from skilled therapeutic intervention in order to improve the following deficits and impairments:  Dizziness  Visit Diagnosis: Dizziness and giddiness     Problem List Patient Active Problem List   Diagnosis Date Noted  . Hypertension 09/03/2017   09/05/2017 PT, DPT, GCS  Tateanna Bach 01/26/2021, 11:12 AM  [place] [facility] [address] [place], [place], [place] Phone: [phone]   Fax:  [phone]  Name: Irineo Gaulin MRN: Joseph Ho Date of Birth:  1966/02/13

## 2021-02-14 ENCOUNTER — Ambulatory Visit: Payer: Federal, State, Local not specified - PPO | Attending: Family Medicine

## 2021-02-14 DIAGNOSIS — R42 Dizziness and giddiness: Secondary | ICD-10-CM | POA: Insufficient documentation

## 2021-02-14 NOTE — Patient Instructions (Incomplete)
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 One minute holds in each position and retesting between maneuvers. After first maneuver and with each retest pt reports no further vertigo and has no nystagmus. Pt issued HEP for home Epley maneuver with education about how to perform.

## 2021-02-22 ENCOUNTER — Ambulatory Visit: Payer: Federal, State, Local not specified - PPO

## 2021-02-22 NOTE — Patient Instructions (Incomplete)
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 One minute holds in each position and retesting between maneuvers. After first maneuver and with each retest pt reports no further vertigo and has no nystagmus. Pt issued HEP for home Epley maneuver with education about how to perform.

## 2021-02-26 ENCOUNTER — Ambulatory Visit: Payer: Federal, State, Local not specified - PPO

## 2021-02-28 ENCOUNTER — Ambulatory Visit: Payer: Federal, State, Local not specified - PPO

## 2021-02-28 ENCOUNTER — Other Ambulatory Visit: Payer: Self-pay

## 2021-02-28 DIAGNOSIS — R42 Dizziness and giddiness: Secondary | ICD-10-CM

## 2021-02-28 NOTE — Therapy (Signed)
[place] [facility] [address] [place], [place], [address] Phone: [phone]   Fax:  [phone]  Physical Therapy Treatment/Discharge  Patient Details  Name: Joseph Ho MRN: [id number] Date of Birth: 1966-09-21 Referring Provider (PT): Dr. Burnett Sheng   Encounter Date: 02/28/2021   PT End of Session - 02/28/21 1401    Visit Number 2    Number of Visits 5    Date for PT Re-Evaluation 02/23/21    Authorization Type eval: 01/26/21    PT Start Time 1350    PT Stop Time 1430    PT Time Calculation (min) 40 min    Activity Tolerance Patient tolerated treatment well    Behavior During Therapy [facility] for tasks assessed/performed           Past Medical History:  Diagnosis Date   Hypertension     History reviewed. No pertinent surgical history.  There were no vitals filed for this visit.   Subjective Assessment - 02/28/21 1352    Subjective Patient reports that he is feeling well today.  Denies any further episodes of vertigo.  Does state that he had a short episode of dizziness while he was at the optometrist yesterday during his eye exam however denies any true vertigo.    Pertinent History Pt reports that approximately 9 months ago he sat up in bed and "felt something pop." He states that after that when he would roll in bed or try to tie his shoes he would get dizzy. His symptoms last for 10-15 seconds and happen daily. Symptoms have been unchanged since onset. He has a history of PTSD. Has tried meclizine without benefit. Reports bilateral feet numbness for multiple years due to cold exposure and near frostbite years ago. Otherwise he denies any recent health changes.    Diagnostic tests None    Patient Stated Goals Improve dizziness    Currently in Pain? No/denies                TREATMENT   Canalith Repositioning Treatment Performed Dix-hallpike test for patient which is negative bilaterally. Pt treated with 1 bout of  Epley Maneuver for the L side due to previous positive testing and to review proper home treatment. One minute holds in each position. Pt reports that he is confident performing Epley at home if symptoms return.  He denies any vertigo during treatment and no nystagmus observed.   Neuromuscular Re-education   Pt completed ABC (97.4%) and DHI (4/100);   Discussed plan of care if dizziness returns.  Reviewed signs and symptoms of CVA with patient to ensure for induration between BPPV and central causes of dizziness. At patient's request perform quick screen of R hip pain. Pt with pain during passive R hip flexion, FADIR, and FABER positions. Significant limitation in R hip IR. No pain to palpation around entire R hip. Pain relief with R hip distraction and R hip flexor stretch off side of table (no obvious signs of hip flexor tightness). Pt reports that he saw ortho with diagnosis of R hip OA. He has previously received a steroid injection in his R hip which was helpful. Pt encouraged to obtain and order from MD if he would like to try physical therapy for his R hip pain.      Patient denies any further episodes of vertigo since the initial evaluation/treatment.  His ABC has improved to 97.4% his DHI has decreased to 4/100. Performed Dix-hallpike test for patient which is negative bilaterally.  Pt treated with 1 bout of Epley Maneuver for the L side due to previous positive testing and to review proper home treatment. One minute holds in each position. Pt reports that he is confident performing Epley at home if symptoms return.  He denies any vertigo during treatment and no nystagmus observed. Reviewed signs and symptoms of CVA with patient to ensure for induration between BPPV and central causes of dizziness. At patient's request performed quick screen of R hip pain. Pt with pain during passive R hip flexion, FADIR, and FABER positions. Significant limitation in R hip IR. No pain to palpation around entire R  hip. Pain relief with R hip distraction and R hip flexor stretch off side of table (no obvious signs of hip flexor tightness). Pt reports that he saw ortho with diagnosis of R hip OA. He has previously received a steroid injection in his R hip which was helpful. Pt encouraged to obtain and order from MD if he would like to try physical therapy for his R hip pain.  At this time patient will be discharged from therapy with respect to his vertigo.        PT Short Term Goals - 02/28/21 1353      PT SHORT TERM GOAL #1   Title Pt will be independent with home Epley maneuver to treat BPPV and resolve symptoms at home and work    Time 2    Period Weeks    Status Achieved    Target Date 02/09/21             PT Long Term Goals - 02/28/21 1353      PT LONG TERM GOAL #1   Title Pt will improve FOTO to at least  in order to demonstrate clinically significant improvement in function related to dizziness    Baseline 01/26/21: 39; 02/28/21: 70    Time 4    Period Weeks    Status Achieved      PT LONG TERM GOAL #2   Title Pt will improve ABC by at least 13% in order to demonstrate clinically significant improvement in balance confidence.    Baseline 01/26/21: 40%; 02/28/21: 97.4%    Time 4    Period Weeks    Status Achieved      PT LONG TERM GOAL #3   Title Pt will decrease DHI score by at least 18 points in order to demonstrate clinically significant reduction in disability    Baseline 01/26/21: 32/100; 02/28/21: 4/100    Time 4    Period Weeks    Status Achieved      PT LONG TERM GOAL #4   Title Pt will report no further episodes of dizziness when rolling in bed or bending forward to tie shoes in order to resume full function without symptoms    Baseline 02/28/21: No further episodes of dizziness    Time 4    Period Weeks    Status Achieved                 Plan - 02/28/21 1401    Clinical Impression Statement Patient denies any further episodes of vertigo since the initial  evaluation/treatment.  His ABC has improved to 97.4% his DHI has decreased to 4/100. Performed Dix-hallpike test for patient which is negative bilaterally. Pt treated with 1 bout of Epley Maneuver for the L side due to previous positive testing and to review proper home treatment. One minute holds in each position. Pt reports that he  is confident performing Epley at home if symptoms return.  He denies any vertigo during treatment and no nystagmus observed. Reviewed signs and symptoms of CVA with patient to ensure for induration between BPPV and central causes of dizziness. At patient's request performed quick screen of R hip pain. Pt with pain during passive R hip flexion, FADIR, and FABER positions. Significant limitation in R hip IR. No pain to palpation around entire R hip. Pain relief with R hip distraction and R hip flexor stretch off side of table (no obvious signs of hip flexor tightness). Pt reports that he saw ortho with diagnosis of R hip OA. He has previously received a steroid injection in his R hip which was helpful. Pt encouraged to obtain and order from MD if he would like to try physical therapy for his R hip pain.  At this time patient will be discharged from therapy with respect to his vertigo.    Personal Factors and Comorbidities Comorbidity 2;Time since onset of injury/illness/exacerbation    Comorbidities HTN, migraines    Examination-Activity Limitations Bed Mobility;Transfers;Bend    Examination-Participation Restrictions Community Activity;Occupation    Stability/Clinical Decision Making Stable/Uncomplicated    Rehab Potential Excellent    PT Frequency 1x / week    PT Duration 4 weeks    PT Treatment/Interventions ADLs/Self Care Home Management;Aquatic Therapy;Biofeedback;Canalith Repostioning;Cryotherapy;Electrical Stimulation;Iontophoresis 4mg /ml Dexamethasone;Moist Heat;Traction;Ultrasound;DME Instruction;Gait training;Stair training;Functional mobility training;Therapeutic  activities;Therapeutic exercise;Balance training;Neuromuscular re-education;Cognitive remediation;Patient/family education;Manual techniques;Passive range of motion;Dry needling;Vestibular;Spinal Manipulations;Joint Manipulations    PT Next Visit Plan Discharge    PT Home Exercise Plan Access Code:    Consulted and Agree with Plan of Care Patient           Patient will benefit from skilled therapeutic intervention in order to improve the following deficits and impairments:  Dizziness  Visit Diagnosis: Dizziness and giddiness     Problem List Patient Active Problem List   Diagnosis Date Noted   Hypertension 09/03/2017   09/05/2017 PT, DPT, GCS  Donavan Kerlin 03/01/2021, 2:27 PM  [place] [facility] [address] [place], [place], [place] Phone: [phone]   Fax:  [phone]  Name: Dow Blahnik MRN: [name] Date of Birth: December 14, 1965

## 2021-04-09 ENCOUNTER — Other Ambulatory Visit: Payer: Self-pay | Admitting: *Deleted

## 2021-04-09 ENCOUNTER — Other Ambulatory Visit
Admission: RE | Admit: 2021-04-09 | Discharge: 2021-04-09 | Disposition: A | Discharge status: Home / Self Care | Payer: Federal, State, Local not specified - PPO | Attending: Urology | Admitting: Urology

## 2021-04-09 ENCOUNTER — Other Ambulatory Visit: Payer: Self-pay

## 2021-04-09 DIAGNOSIS — R972 Elevated prostate specific antigen [PSA]: Secondary | ICD-10-CM | POA: Diagnosis not present

## 2021-04-09 LAB — PSA: Prostatic Specific Antigen: 2.02 ng/mL (ref 0.00–4.00)

## 2021-04-09 NOTE — Addendum Note (Signed)
Addended by: Sueanne Margarita on: 04/09/2021 08:12 AM   Modules accepted: Orders

## 2021-04-13 NOTE — Progress Notes (Deleted)
04/16/2021 9:32 AM   Joseph Ho 12/06/1966 [id number]  Referring provider: Jerl Mina, MD [address] [facility] [place],  [place] [address]  No chief complaint on file.  Urological history: 1. Elevated PSA -PSA trend  2.92 ng/mL on 08/07/2015  3.55 ng/mL on 07/30/2016  4.87 ng/mL on 08/06/2017  4.5 ng/mL in 08/2017 - PSA, free 0.75 - PSA, Free Pct 16.7% - 17% probability  3.6 ng/mL in 12/2017  4.5 ng/mL in 02/2018  4.0 ng/mL in 05/2018  3.0 ng/mL in 07/2018 - started finasteride   2.6 (5.2) ng/mL in 08/2018  2.7 (5.4) ng/mL in 10/2018  2.6 (5.2) ng/mL in 11/2018 - prostate MRI no evidence of high-grade carcinoma in the peripheral zone.  Mild prostate enlargement.  PSAD 0.108.  PSAD less than 0.15 ng/mL/mL was the most well studied and accurate negative predictive factor for clinically significant prostate cancer diagnosis.  Recommend the use of PSAD less than 0.15 ng/mL/mL along with negative MRI results to omit biopsy indication in cancer naive patients.  Predictive Factors of Missed Clinically Significant Prostate Cancers in Men with Negative Magnetic Resonance Imaging: A Systematic Review and Meta-Analysis.  Journal of Urology: Vol. 204, 24-32 July 2020.   2.07 (4.14) ng/mL in 08/2019  1.47 (2.94) ng/mL in 02/2020  1.76 (3.52) ng/mL in 09/2020  2.02 (4.04) ng/mL in 04/2021  2. BPH with LU TS -I PSS *** -PVR *** -managed with finasteride 5 mg daily  3. ED -contributing factors of age, BPH, and HTN -SHIM *** -managed with sildenafil 100 mg, on-demand-dosing   HPI: Joseph Ho is a 55 y.o. male who presents today for a 6 month follow up.      Score:  1-7 Mild 8-19 Moderate 20-35 Severe       Score: 1-7 Severe ED 8-11 Moderate ED 12-16 Mild-Moderate ED 17-21 Mild ED 22-25 No ED  PMH: Past Medical History:  Diagnosis Date  . Hypertension     Surgical History: No past surgical history on file.  Home Medications:  Allergies  as of 04/16/2021   No Known Allergies     Medication List       Accurate as of Apr 13, 2021  9:32 AM. If you have any questions, ask your nurse or doctor.        finasteride 5 MG tablet Commonly known as: PROSCAR TAKE 1 TABLET(5 MG) BY MOUTH DAILY   lisinopril-hydrochlorothiazide 20-12.5 MG tablet Commonly known as: ZESTORETIC Take by mouth.   sildenafil 100 MG tablet Commonly known as: VIAGRA Take 1 tablet (100 mg total) by mouth daily as needed for erectile dysfunction. Take two hours prior to intercourse on an empty stomach   tadalafil 20 MG tablet Commonly known as: Cialis Take 1 tablet (20 mg total) by mouth daily as needed for erectile dysfunction.       Allergies: No Known Allergies  Family History: Family History  Problem Relation Age of Onset  . Prostate cancer Father   . Bladder Cancer Neg Hx   . Kidney cancer Neg Hx     Social History:  reports that he has never smoked. He has never used smokeless tobacco. He reports that he does not drink alcohol and does not use drugs.  ROS: For pertinent review of systems please refer to history of present illness  Physical Exam: There were no vitals taken for this visit.  Constitutional:  Well nourished. Alert and oriented, No acute distress. HEENT: [place] AT, moist mucus membranes.  Trachea midline Cardiovascular: No clubbing, cyanosis, or edema. Respiratory: Normal respiratory effort, no increased work of breathing. GI: Abdomen is soft, non tender, non distended, no abdominal masses. Liver and spleen not palpable.  No hernias appreciated.  Stool sample for occult testing is not indicated.   GU: No CVA tenderness.  No bladder fullness or masses.  Patient with circumcised/uncircumcised phallus. ***Foreskin easily retracted***  Urethral meatus is patent.  No penile discharge. No penile lesions or rashes. Scrotum without lesions, cysts, rashes and/or edema.  Testicles are located scrotally bilaterally. No masses are  appreciated in the testicles. Left and right epididymis are normal. Rectal: Patient with  normal sphincter tone. Anus and perineum without scarring or rashes. No rectal masses are appreciated. Prostate is approximately *** grams, *** nodules are appreciated. Seminal vesicles are normal. Skin: No rashes, bruises or suspicious lesions. Lymph: No inguinal adenopathy. Neurologic: Grossly intact, no focal deficits, moving all 4 extremities. Psychiatric: Normal mood and affect.   Laboratory Data: Component     Latest Ref Rng & Units 08/19/2019 02/28/2020 10/06/2020 04/09/2021  Prostatic Specific Antigen     0.00 - 4.00 ng/mL 2.07 1.47 1.76 2.02  I have reviewed the labs.   Assessment & Plan:    1. History of elevated PSA Continue finasteride RTC in 6 months for PSA and exam  2. BPH with LUTS IPSS score is 1/0, it is improved Continue conservative management, avoiding bladder irritants and timed voiding's Continue finasteride  RTC in 6 months for I PSS, PSA and exam   3. ED SHIM score 17  Viagra is effective, but it has a delayed reaction RTC in 6 months for SHIM and exam   No follow-ups on file.  These notes generated with voice recognition software. I apologize for typographical errors.  Michiel Cowboy, PA-C  [facility] [address] [place], [place] [address] [phone]

## 2021-04-16 ENCOUNTER — Ambulatory Visit: Payer: Federal, State, Local not specified - PPO | Admitting: Urology

## 2021-04-16 DIAGNOSIS — N138 Other obstructive and reflux uropathy: Secondary | ICD-10-CM

## 2021-04-16 DIAGNOSIS — R972 Elevated prostate specific antigen [PSA]: Secondary | ICD-10-CM

## 2021-04-16 DIAGNOSIS — N529 Male erectile dysfunction, unspecified: Secondary | ICD-10-CM

## 2021-05-06 ENCOUNTER — Other Ambulatory Visit: Payer: Self-pay | Admitting: Urology

## 2021-05-08 ENCOUNTER — Encounter: Payer: Self-pay | Admitting: *Deleted

## 2021-05-08 NOTE — Telephone Encounter (Signed)
Left message for patient to be seen office . No refill on his finasteride

## 2021-05-08 NOTE — Addendum Note (Signed)
Addended by: Levada Schilling on: 05/08/2021 09:08 AM   Modules accepted: Orders

## 2021-05-14 NOTE — Progress Notes (Deleted)
05/15/2021 10:18 AM   Joseph Ho [birth year] [id number]  Referring provider: Jerl Mina, MD [address] [facility] [place],  [place] [address]  No chief complaint on file.  Urological history: 1. Elevated PSA -PSA trend  2.92 ng/mL on 08/07/2015  3.55 ng/mL on 07/30/2016  4.87 ng/mL on 08/06/2017  4.5 ng/mL in 08/2017 - PSA, free 0.75 - PSA, Free Pct 16.7% - 17% probability  3.6 ng/mL in 12/2017  4.5 ng/mL in 02/2018  4.0 ng/mL in 05/2018  3.0 ng/mL in 07/2018 - started finasteride   2.6 (5.2) ng/mL in 08/2018  2.7 (5.4) ng/mL in 10/2018  2.6 (5.2) ng/mL in 11/2018 - prostate MRI no evidence of high-grade carcinoma in the peripheral zone.  Mild prostate enlargement.  PSAD 0.108.  PSAD less than 0.15 ng/mL/mL was the most well studied and accurate negative predictive factor for clinically significant prostate cancer diagnosis.  Recommend the use of PSAD less than 0.15 ng/mL/mL along with negative MRI results to omit biopsy indication in cancer naive patients.  Predictive Factors of Missed Clinically Significant Prostate Cancers in Men with Negative Magnetic Resonance Imaging: A Systematic Review and Meta-Analysis.  Journal of Urology: Vol. 204, 24-32 July 2020.   2.07 (4.14) ng/mL in 08/2019  1.47 (2.94) ng/mL in 02/2020  1.76 (3.52) ng/mL in 09/2020  2.02 (4.04) ng/mL in 04/2021  2. BPH with LU TS -I PSS *** -PVR *** -managed with finasteride 5 mg daily  3. ED -contributing factors of age, BPH, and HTN -SHIM *** -managed with sildenafil 100 mg, on-demand-dosing   HPI: Joseph Ho is a 55 y.o. male who presents today for a 6 month follow up.      Score:  1-7 Mild 8-19 Moderate 20-35 Severe       Score: 1-7 Severe ED 8-11 Moderate ED 12-16 Mild-Moderate ED 17-21 Mild ED 22-25 No ED  PMH: Past Medical History:  Diagnosis Date  . Hypertension     Surgical History: No past surgical history on file.  Home Medications:  Allergies  as of 05/15/2021   No Known Allergies     Medication List       Accurate as of May 14, 2021 10:18 AM. If you have any questions, ask your nurse or doctor.        lisinopril-hydrochlorothiazide 20-12.5 MG tablet Commonly known as: ZESTORETIC Take by mouth.   sildenafil 100 MG tablet Commonly known as: VIAGRA Take 1 tablet (100 mg total) by mouth daily as needed for erectile dysfunction. Take two hours prior to intercourse on an empty stomach   tadalafil 20 MG tablet Commonly known as: Cialis Take 1 tablet (20 mg total) by mouth daily as needed for erectile dysfunction.       Allergies: No Known Allergies  Family History: Family History  Problem Relation Age of Onset  . Prostate cancer Father   . Bladder Cancer Neg Hx   . Kidney cancer Neg Hx     Social History:  reports that he has never smoked. He has never used smokeless tobacco. He reports that he does not drink alcohol and does not use drugs.  ROS: For pertinent review of systems please refer to history of present illness  Physical Exam: There were no vitals taken for this visit.  Constitutional:  Well nourished. Alert and oriented, No acute distress. HEENT: [place] AT, moist mucus membranes.  Trachea midline Cardiovascular: No clubbing, cyanosis, or edema. Respiratory: Normal respiratory effort, no increased work of breathing. GI:  Abdomen is soft, non tender, non distended, no abdominal masses. Liver and spleen not palpable.  No hernias appreciated.  Stool sample for occult testing is not indicated.   GU: No CVA tenderness.  No bladder fullness or masses.  Patient with circumcised/uncircumcised phallus. ***Foreskin easily retracted***  Urethral meatus is patent.  No penile discharge. No penile lesions or rashes. Scrotum without lesions, cysts, rashes and/or edema.  Testicles are located scrotally bilaterally. No masses are appreciated in the testicles. Left and right epididymis are normal. Rectal: Patient with  normal  sphincter tone. Anus and perineum without scarring or rashes. No rectal masses are appreciated. Prostate is approximately *** grams, *** nodules are appreciated. Seminal vesicles are normal. Skin: No rashes, bruises or suspicious lesions. Lymph: No inguinal adenopathy. Neurologic: Grossly intact, no focal deficits, moving all 4 extremities. Psychiatric: Normal mood and affect.   Laboratory Data: Component     Latest Ref Rng & Units 08/19/2019 02/28/2020 10/06/2020 04/09/2021  Prostatic Specific Antigen     0.00 - 4.00 ng/mL 2.07 1.47 1.76 2.02  I have reviewed the labs.   Assessment & Plan:    1. Increase in PSA velocity -repeat PSA -if trend continues to increase - recommend prostate biopsy  2. BPH with LUTS IPSS score is 1/0, it is improved Continue conservative management, avoiding bladder irritants and timed voiding's Continue finasteride  RTC in 6 months for I PSS, PSA and exam   3. ED SHIM score 17  Viagra is effective, but it has a delayed reaction RTC in 6 months for SHIM and exam   No follow-ups on file.  These notes generated with voice recognition software. I apologize for typographical errors.  Joseph Cowboy, PA-C  [facility] [address] [place], [place] [address] [phone]

## 2021-05-15 ENCOUNTER — Ambulatory Visit: Payer: Federal, State, Local not specified - PPO | Admitting: Urology

## 2021-05-22 NOTE — Progress Notes (Signed)
05/23/2021 9:34 AM   Joseph Ho Feb 10, 1966 [id number]  Referring provider: Jerl Mina, MD [address] [facility] [place],  [place] [address]  Chief Complaint  Patient presents with   Benign Prostatic Hypertrophy    Urological history: 1. Elevated PSA -PSA trend  2.92 ng/mL on 08/07/2015  3.55 ng/mL on 07/30/2016  4.87 ng/mL on 08/06/2017  4.5 ng/mL in 08/2017 - PSA, free 0.75 - PSA, Free Pct 16.7% - 17% probability  3.6 ng/mL in 12/2017  4.5 ng/mL in 02/2018  4.0 ng/mL in 05/2018  3.0 ng/mL in 07/2018 - started finasteride   2.6 (5.2) ng/mL in 08/2018  2.7 (5.4) ng/mL in 10/2018  2.6 (5.2) ng/mL in 11/2018 - prostate MRI no evidence of high-grade carcinoma in the peripheral zone.  Mild prostate enlargement.  PSAD 0.108.  PSAD less than 0.15 ng/mL/mL was the most well studied and accurate negative predictive factor for clinically significant prostate cancer diagnosis.  Recommend the use of PSAD less than 0.15 ng/mL/mL along with negative MRI results to omit biopsy indication in cancer naive patients.  Predictive Factors of Missed Clinically Significant Prostate Cancers in Men with Negative Magnetic Resonance Imaging: A Systematic Review and Meta-Analysis.  Journal of Urology: Vol. 204, 24-32 July 2020.   2.07 (4.14) ng/mL in 08/2019  1.47 (2.94) ng/mL in 02/2020  1.76 (3.52) ng/mL in 09/2020  2.02 (4.04) ng/mL in 04/2021  2. BPH with LU TS -I PSS 1/0 -managed with finasteride 5 mg daily  3. ED -contributing factors of age, BPH, and HTN -SHIM 7 -managed with sildenafil 100 mg, on-demand-dosing   HPI: Joseph Ho is a 55 y.o. male who presents today for a 6 month follow up.   He has no urinary complaints at this visit.  Patient denies any modifying or aggravating factors.  Patient denies any gross hematuria, dysuria or suprapubic/flank pain.  Patient denies any fevers, chills, nausea or vomiting.      IPSS     Row Name 05/23/21 0900          International Prostate Symptom Score   How often have you had the sensation of not emptying your bladder? Not at All     How often have you had to urinate less than every two hours? Not at All     How often have you found you stopped and started again several times when you urinated? Less than 1 in 5 times     How often have you found it difficult to postpone urination? Not at All     How often have you had a weak urinary stream? Less than half the time     How often have you had to strain to start urination? Not at All     How many times did you typically get up at night to urinate? 1 Time     Total IPSS Score 4           Quality of Life due to urinary symptoms     If you were to spend the rest of your life with your urinary condition just the way it is now how would you feel about that? Pleased             Score:  1-7 Mild 8-19 Moderate 20-35 Severe     Patient still having spontaneous erections.   He denies any pain or curvature with erections.  He is having issues with delayed efficacy with sildenafil.   SHIM  Row Name 05/23/21 0905         SHIM: Over the last 6 months:   How do you rate your confidence that you could get and keep an erection? Very Low     When you had erections with sexual stimulation, how often were your erections hard enough for penetration (entering your partner)? A Few Times (much less than half the time)     During sexual intercourse, how often were you able to maintain your erection after you had penetrated (entered) your partner? A Few Times (much less than half the time)     During sexual intercourse, how difficult was it to maintain your erection to completion of intercourse? Extremely Difficult     When you attempted sexual intercourse, how often was it satisfactory for you? Almost Never or Never           SHIM Total Score     SHIM 7             Score: 1-7 Severe ED 8-11 Moderate ED 12-16 Mild-Moderate ED 17-21 Mild ED 22-25 No  ED  PMH: Past Medical History:  Diagnosis Date   Hypertension     Surgical History: No past surgical history on file.  Home Medications:  Allergies as of 05/23/2021   No Known Allergies      Medication List        Accurate as of May 23, 2021  9:34 AM. If you have any questions, ask your nurse or doctor.          finasteride 5 MG tablet Commonly known as: PROSCAR Take 5 mg by mouth daily.   lisinopril-hydrochlorothiazide 20-12.5 MG tablet Commonly known as: ZESTORETIC Take by mouth.   sildenafil 100 MG tablet Commonly known as: VIAGRA Take 1 tablet (100 mg total) by mouth daily as needed for erectile dysfunction. Take two hours prior to intercourse on an empty stomach   tadalafil 20 MG tablet Commonly known as: Cialis Take 1 tablet (20 mg total) by mouth daily as needed for erectile dysfunction.        Allergies: No Known Allergies  Family History: Family History  Problem Relation Age of Onset   Prostate cancer Father    Bladder Cancer Neg Hx    Kidney cancer Neg Hx     Social History:  reports that he has never smoked. He has never used smokeless tobacco. He reports that he does not drink alcohol and does not use drugs.  ROS: For pertinent review of systems please refer to history of present illness  Physical Exam: BP 113/78   Pulse 66   Ht 5\' 9"  (1.753 m)   Wt 176 lb (79.8 kg)   BMI 25.99 kg/m   Constitutional:  Well nourished. Alert and oriented, No acute distress. HEENT: [place] AT, mask in place  Trachea midline Cardiovascular: No clubbing, cyanosis, or edema. Respiratory: Normal respiratory effort, no increased work of breathing. GU: No CVA tenderness.  No bladder fullness or masses.  Patient with uncircumcised phallus. Foreskin easily retracted Urethral meatus is patent.  No penile discharge. No penile lesions or rashes. Scrotum without lesions, cysts, rashes and/or edema.  Testicles are located scrotally bilaterally. No masses are  appreciated in the testicles. Left and right epididymis are normal. Rectal: Patient with  normal sphincter tone. Anus and perineum without scarring or rashes. No rectal masses are appreciated. Prostate is approximately 50 grams, no nodules are appreciated. Seminal vesicles are normal. Lymph: No inguinal adenopathy. Neurologic: Grossly intact,  no focal deficits, moving all 4 extremities. Psychiatric: Normal mood and affect.   Laboratory Data: Component     Latest Ref Rng & Units 08/19/2019 02/28/2020 10/06/2020 04/09/2021  Prostatic Specific Antigen     0.00 - 4.00 ng/mL 2.07 1.47 1.76 2.02  I have reviewed the labs.   Assessment & Plan:    1. Increase in PSA velocity -repeat PSA -if trend continues to increase - recommend prostate biopsy as it would be concerning for prostate cancer  -discussed the procedure and the risks involved, such as blood in urine, blood in stool, blood in semen, infection, urinary retention, and on rare occasions sepsis and death.    2. BPH with LUTS -continue finasteride 5 mg daily  3. ED -continue sildenafil 100 mg, on-demand-dosing   Return in about 1 month (around 06/22/2021) for PSA and testosterone only .  These notes generated with voice recognition software. I apologize for typographical errors.  Michiel Cowboy, PA-C  [facility] [address] [place], [place] [address] [phone]

## 2021-05-23 ENCOUNTER — Encounter: Payer: Self-pay | Admitting: Urology

## 2021-05-23 ENCOUNTER — Other Ambulatory Visit: Payer: Self-pay

## 2021-05-23 ENCOUNTER — Ambulatory Visit: Payer: Federal, State, Local not specified - PPO | Admitting: Urology

## 2021-05-23 VITALS — BP 113/78 | HR 66 | Ht 69.0 in | Wt 176.0 lb

## 2021-05-23 DIAGNOSIS — N401 Enlarged prostate with lower urinary tract symptoms: Secondary | ICD-10-CM

## 2021-05-23 DIAGNOSIS — N138 Other obstructive and reflux uropathy: Secondary | ICD-10-CM | POA: Diagnosis not present

## 2021-05-23 DIAGNOSIS — N529 Male erectile dysfunction, unspecified: Secondary | ICD-10-CM

## 2021-05-23 DIAGNOSIS — R972 Elevated prostate specific antigen [PSA]: Secondary | ICD-10-CM

## 2021-05-30 ENCOUNTER — Other Ambulatory Visit: Payer: Self-pay | Admitting: Family Medicine

## 2021-05-30 DIAGNOSIS — R222 Localized swelling, mass and lump, trunk: Secondary | ICD-10-CM

## 2021-06-19 ENCOUNTER — Other Ambulatory Visit: Payer: Self-pay

## 2021-06-19 ENCOUNTER — Ambulatory Visit
Admission: RE | Admit: 2021-06-19 | Discharge: 2021-06-19 | Disposition: A | Discharge status: Home / Self Care | Payer: Federal, State, Local not specified - PPO | Source: Ambulatory Visit | Attending: Family Medicine | Admitting: Family Medicine

## 2021-06-19 ENCOUNTER — Ambulatory Visit: Payer: Federal, State, Local not specified - PPO

## 2021-06-19 DIAGNOSIS — R222 Localized swelling, mass and lump, trunk: Secondary | ICD-10-CM | POA: Insufficient documentation

## 2021-07-06 ENCOUNTER — Other Ambulatory Visit: Payer: Self-pay | Admitting: Urology

## 2021-07-29 ENCOUNTER — Other Ambulatory Visit: Payer: Self-pay | Admitting: Urology

## 2021-08-23 ENCOUNTER — Other Ambulatory Visit: Payer: Self-pay | Admitting: Urology

## 2021-09-27 ENCOUNTER — Other Ambulatory Visit: Payer: Self-pay

## 2021-09-27 ENCOUNTER — Other Ambulatory Visit
Admission: RE | Admit: 2021-09-27 | Discharge: 2021-09-27 | Disposition: A | Discharge status: Home / Self Care | Payer: Federal, State, Local not specified - PPO | Attending: Urology | Admitting: Urology

## 2021-09-27 DIAGNOSIS — R972 Elevated prostate specific antigen [PSA]: Secondary | ICD-10-CM | POA: Diagnosis not present

## 2021-09-27 DIAGNOSIS — N401 Enlarged prostate with lower urinary tract symptoms: Secondary | ICD-10-CM | POA: Diagnosis present

## 2021-09-27 DIAGNOSIS — N138 Other obstructive and reflux uropathy: Secondary | ICD-10-CM | POA: Diagnosis present

## 2021-09-27 DIAGNOSIS — N529 Male erectile dysfunction, unspecified: Secondary | ICD-10-CM | POA: Insufficient documentation

## 2021-09-27 LAB — PSA: Prostatic Specific Antigen: 2.05 ng/mL (ref 0.00–4.00)

## 2021-09-28 LAB — TESTOSTERONE: Testosterone: 347 ng/dL (ref 264–916)

## 2021-10-11 ENCOUNTER — Other Ambulatory Visit: Payer: Self-pay | Admitting: Urology

## 2021-10-25 ENCOUNTER — Telehealth: Payer: Self-pay | Admitting: Urology

## 2021-10-25 NOTE — Telephone Encounter (Signed)
Spoke with patient and advised that refill will be done at appt on 11/19/21

## 2021-10-25 NOTE — Telephone Encounter (Signed)
Pt called in in regards to his prescription for Sildenafil (Viagra). It should be sent to the [facility] pharmacy on [address]. Pt would like a call back.

## 2021-11-16 ENCOUNTER — Other Ambulatory Visit: Payer: Self-pay

## 2021-11-16 DIAGNOSIS — Z87898 Personal history of other specified conditions: Secondary | ICD-10-CM

## 2021-11-19 ENCOUNTER — Other Ambulatory Visit
Admission: RE | Admit: 2021-11-19 | Discharge: 2021-11-19 | Disposition: A | Discharge status: Home / Self Care | Payer: Federal, State, Local not specified - PPO | Attending: Urology | Admitting: Urology

## 2021-11-19 ENCOUNTER — Other Ambulatory Visit: Payer: Federal, State, Local not specified - PPO

## 2021-11-19 DIAGNOSIS — Z87898 Personal history of other specified conditions: Secondary | ICD-10-CM

## 2021-11-19 LAB — PSA: Prostatic Specific Antigen: 1.94 ng/mL (ref 0.00–4.00)

## 2021-11-22 NOTE — Progress Notes (Signed)
11/23/2021 10:14 AM   Joseph Ho 08-Feb-1966 [id number]  Referring provider: Maryland Pink, MD [address] [facility] [place],  [place] [address]  Chief Complaint  Patient presents with   Results    PSA    Urological history: 1. Elevated PSA -PSA trend  2.92 ng/mL on 08/07/2015  3.55 ng/mL on 07/30/2016  4.87 ng/mL on 08/06/2017  4.5 ng/mL in 08/2017 - PSA, free 0.75 - PSA, Free Pct 16.7% - 17% probability  3.6 ng/mL in 12/2017  4.5 ng/mL in 02/2018  4.0 ng/mL in 05/2018  3.0 ng/mL in 07/2018 - started finasteride   2.6 (5.2) ng/mL in 08/2018  2.7 (5.4) ng/mL in 10/2018  2.6 (5.2) ng/mL in 11/2018 - prostate MRI no evidence of high-grade carcinoma in the peripheral zone.  Mild prostate enlargement.  PSAD 0.108.  PSAD less than 0.15 ng/mL/mL was the most well studied and accurate negative predictive factor for clinically significant prostate cancer diagnosis.  Recommend the use of PSAD less than 0.15 ng/mL/mL along with negative MRI results to omit biopsy indication in cancer naive patients.  Predictive Factors of Missed Clinically Significant Prostate Cancers in Men with Negative Magnetic Resonance Imaging: A Systematic Review and Meta-Analysis.  Journal of Urology: Vol. 204, 24-32 July 2020.   2.07 (4.14) ng/mL in 08/2019  1.47 (2.94) ng/mL in 02/2020  1.76 (3.52) ng/mL in 09/2020  2.02 (4.04) ng/mL in 04/2021  2.05 (4.10) ng/mL in 10/2021  1.94 (3.88) ng/mL in 11/2021  2. BPH with LU TS -I PSS 1/1 -managed with finasteride 5 mg daily  3. ED -contributing factors of age, BPH, and HTN -SHIM 8 -managed with sildenafil 100 mg, on-demand-dosing   HPI: Joseph Ho is a 55 y.o. male who presents today for a 6 month follow up.      IPSS     Row Name 11/23/21 0900         International Prostate Symptom Score   How often have you had the sensation of not emptying your bladder? Not at All     How often have you had to urinate less than every two  hours? Not at All     How often have you found you stopped and started again several times when you urinated? Not at All     How often have you found it difficult to postpone urination? Not at All     How often have you had a weak urinary stream? Not at All     How often have you had to strain to start urination? Not at All     How many times did you typically get up at night to urinate? 1 Time     Total IPSS Score 1       Quality of Life due to urinary symptoms   If you were to spend the rest of your life with your urinary condition just the way it is now how would you feel about that? Pleased               Score:  1-7 Mild 8-19 Moderate 20-35 Severe       SHIM     Row Name 11/23/21 0955         SHIM: Over the last 6 months:   How do you rate your confidence that you could get and keep an erection? Very Low     When you had erections with sexual stimulation, how often were your erections hard enough for  penetration (entering your partner)? A Few Times (much less than half the time)     During sexual intercourse, how often were you able to maintain your erection after you had penetrated (entered) your partner? A Few Times (much less than half the time)     During sexual intercourse, how difficult was it to maintain your erection to completion of intercourse? Very Difficult     When you attempted sexual intercourse, how often was it satisfactory for you? Almost Never or Never       SHIM Total Score   SHIM 8               Score: 1-7 Severe ED 8-11 Moderate ED 12-16 Mild-Moderate ED 17-21 Mild ED 22-25 No ED  PMH: Past Medical History:  Diagnosis Date   Hypertension     Surgical History: No past surgical history on file.  Home Medications:  Allergies as of 11/23/2021   No Known Allergies      Medication List        Accurate as of November 23, 2021 10:14 AM. If you have any questions, ask your nurse or doctor.          carboxymethylcellulose  0.5 % Soln Commonly known as: REFRESH PLUS INSTILL 1 DROP IN EACH EYE FIVE TIMES A DAY   finasteride 5 MG tablet Commonly known as: PROSCAR TAKE 1 TABLET(5 MG) BY MOUTH DAILY   lisinopril-hydrochlorothiazide 20-12.5 MG tablet Commonly known as: ZESTORETIC Take by mouth.   PARoxetine 20 MG tablet Commonly known as: PAXIL Take by mouth.   prazosin 1 MG capsule Commonly known as: MINIPRESS TAKE ONE CAPSULE BY MOUTH AT BEDTIME - DO NOT TAKE WITH SILDENAFIL.   sertraline 100 MG tablet Commonly known as: ZOLOFT TAKE ONE-HALF TABLET BY MOUTH EVERY DAY FOR MOOD  DECREASE PAROXETINE FROM 20MG  TO 10MG  NIGHTLY FOR 3 NIGHTS, THEN STOP.  THE NEXT DAY, START SERTRALINE 50 MG DAILY. FOR MOOD  DECREASE PAROXETINE FROM 20MG  TO 10MG  NIGHTLY FOR 3 NIGHTS, THEN STOP.  THE NEXT DAY, START SERTRALINE 50 MG DAILY.   sildenafil 100 MG tablet Commonly known as: VIAGRA Take 1 tablet (100 mg total) by mouth daily as needed for erectile dysfunction. Take two hours prior to intercourse on an empty stomach   tadalafil 20 MG tablet Commonly known as: Cialis Take 1 tablet (20 mg total) by mouth daily as needed for erectile dysfunction.   topiramate 25 MG tablet Commonly known as: TOPAMAX Take 1 po qpm x 1 week,then 2 qpm   traZODone 50 MG tablet Commonly known as: DESYREL TAKE ONE TABLET BY MOUTH AT BEDTIME FOR SLEEP        Allergies: No Known Allergies  Family History: Family History  Problem Relation Age of Onset   Prostate cancer Father    Bladder Cancer Neg Hx    Kidney cancer Neg Hx     Social History:  reports that he has never smoked. He has never used smokeless tobacco. He reports that he does not drink alcohol and does not use drugs.  ROS: For pertinent review of systems please refer to history of present illness  Physical Exam: BP 119/75 (BP Location: Left Arm, Patient Position: Sitting, Cuff Size: Large)    Pulse 88    Ht 5\' 9"  (1.753 m)    Wt 184 lb (83.5 kg)    BMI 27.17  kg/m   Constitutional:  Well nourished. Alert and oriented, No acute distress. HEENT: [place] AT, mask in  place.  Trachea midline Cardiovascular: No clubbing, cyanosis, or edema. Respiratory: Normal respiratory effort, no increased work of breathing. GU: No CVA tenderness.  No bladder fullness or masses.  Patient with uncircumcised phallus. Foreskin easily retracted  Urethral meatus is patent.  No penile discharge. No penile lesions or rashes. Scrotum without lesions, cysts, rashes and/or edema.  Testicles are located scrotally bilaterally. No masses are appreciated in the testicles. Left and right epididymis are normal. Rectal: Patient with  normal sphincter tone. Anus and perineum without scarring or rashes. No rectal masses are appreciated. Prostate is approximately 45 grams, no nodules are appreciated. Seminal vesicles could not be palpated Neurologic: Grossly intact, no focal deficits, moving all 4 extremities. Psychiatric: Normal mood and affect.   Laboratory Data: Component     Latest Ref Rng & Units 08/19/2019 02/28/2020 10/06/2020 04/09/2021  Prostatic Specific Antigen     0.00 - 4.00 ng/mL 2.07 1.47 1.76 2.02   Component     Latest Ref Rng & Units 09/27/2021 11/19/2021  Prostatic Specific Antigen     0.00 - 4.00 ng/mL 2.05 1.94  I have reviewed the labs.   Assessment & Plan:    1. Increase in PSA velocity -PSA currently in a downward trend -Repeat PSA in 6 months  2. BPH with LUTS -PSA trending downward -DRE benign -continue conservative management, avoiding bladder irritants and timed voiding's -Continue finasteride 5 mg daily  3. ED -continue sildenafil 100 mg, on-demand-dosing-refill given -continue tadalafil 20 mg, on-demand-dosing-refill given -patient finds both medications effective, but cost is a factor at times , so he will fill which one is more cost prohibitive   Return in about 6 months (around 05/24/2022) for IPSS, SHIM, PSA and exam.  These notes generated  with voice recognition software. I apologize for typographical errors.  Michiel Cowboy, PA-C  [facility] [address] [place], [place] [address] [phone]

## 2021-11-23 ENCOUNTER — Ambulatory Visit: Payer: Federal, State, Local not specified - PPO | Admitting: Urology

## 2021-11-23 ENCOUNTER — Encounter: Payer: Self-pay | Admitting: Urology

## 2021-11-23 ENCOUNTER — Other Ambulatory Visit: Payer: Self-pay

## 2021-11-23 VITALS — BP 119/75 | HR 88 | Ht 69.0 in | Wt 184.0 lb

## 2021-11-23 DIAGNOSIS — F4310 Post-traumatic stress disorder, unspecified: Secondary | ICD-10-CM | POA: Insufficient documentation

## 2021-11-23 DIAGNOSIS — N138 Other obstructive and reflux uropathy: Secondary | ICD-10-CM | POA: Diagnosis not present

## 2021-11-23 DIAGNOSIS — N401 Enlarged prostate with lower urinary tract symptoms: Secondary | ICD-10-CM

## 2021-11-23 DIAGNOSIS — H40013 Open angle with borderline findings, low risk, bilateral: Secondary | ICD-10-CM | POA: Insufficient documentation

## 2021-11-23 DIAGNOSIS — N529 Male erectile dysfunction, unspecified: Secondary | ICD-10-CM | POA: Insufficient documentation

## 2021-11-23 MED ORDER — TADALAFIL 20 MG PO TABS: 20.0000 mg | ORAL_TABLET | Freq: Every day | ORAL | 3 refills | Status: DC | PRN

## 2021-11-23 MED ORDER — SILDENAFIL CITRATE 100 MG PO TABS: 100.0000 mg | ORAL_TABLET | Freq: Every day | ORAL | 3 refills | Status: DC | PRN

## 2022-05-17 ENCOUNTER — Other Ambulatory Visit
Admission: RE | Admit: 2022-05-17 | Discharge: 2022-05-17 | Disposition: A | Discharge status: Home / Self Care | Payer: Federal, State, Local not specified - PPO | Attending: Urology | Admitting: Urology

## 2022-05-17 DIAGNOSIS — N138 Other obstructive and reflux uropathy: Secondary | ICD-10-CM | POA: Diagnosis present

## 2022-05-17 DIAGNOSIS — N401 Enlarged prostate with lower urinary tract symptoms: Secondary | ICD-10-CM | POA: Diagnosis present

## 2022-05-17 LAB — PSA: Prostatic Specific Antigen: 2.13 ng/mL (ref 0.00–4.00)

## 2022-05-20 DIAGNOSIS — Z0110 Encounter for examination of ears and hearing without abnormal findings: Secondary | ICD-10-CM | POA: Insufficient documentation

## 2022-05-20 DIAGNOSIS — I10 Essential (primary) hypertension: Secondary | ICD-10-CM | POA: Insufficient documentation

## 2022-05-20 DIAGNOSIS — L84 Corns and callosities: Secondary | ICD-10-CM | POA: Insufficient documentation

## 2022-05-20 NOTE — Progress Notes (Signed)
05/21/2022 10:00 AM   Joseph Ho 11/09/1966 [id number]  Referring provider: Maryland Pink, MD [address] [place],  [place] [address]  Urological history: 1. Elevated PSA -PSA trend  2.92 ng/mL on 08/07/2015  3.55 ng/mL on 07/30/2016  4.87 ng/mL on 08/06/2017  4.5 ng/mL in 08/2017 - PSA, free 0.75 - PSA, Free Pct 16.7% - 17% probability  3.6 ng/mL in 12/2017  4.5 ng/mL in 02/2018  4.0 ng/mL in 05/2018  3.0 ng/mL in 07/2018 - started finasteride   2.6 (5.2) ng/mL in 08/2018  2.7 (5.4) ng/mL in 10/2018  2.6 (5.2) ng/mL in 11/2018 - prostate MRI no evidence of high-grade carcinoma in the peripheral zone.  Mild prostate enlargement.  PSAD 0.108.    2.07 (4.14) ng/mL in 08/2019  1.47 (2.94) ng/mL in 02/2020  1.76 (3.52) ng/mL in 09/2020  2.02 (4.04) ng/mL in 04/2021  2.05 (4.10) ng/mL in 10/2021  1.94 (3.88) ng/mL in 11/2021  2.13 (4.28) ng/mL in 05/2022   2. BPH with LU TS -I PSS 2/0 -managed with finasteride 5 mg daily  3. ED -contributing factors of age, BPH, and HTN -SHIM 9 -managed with sildenafil 100 mg, on-demand-dosing   HPI: Joseph Ho is a 55 y.o. male who presents today for a 6 month follow up.   He states that he had an episode of painless gross heme back in March 2023.  Patient denies any modifying or aggravating factors.  Patient denies any further gross hematuria, dysuria or suprapubic/flank pain.  Patient denies any fevers, chills, nausea or vomiting.     IPSS     Row Name 05/21/22 0900         International Prostate Symptom Score   How often have you had the sensation of not emptying your bladder? Not at All     How often have you had to urinate less than every two hours? Not at All     How often have you found you stopped and started again several times when you urinated? Not at All     How often have you found it difficult to postpone urination? Not at All     How often have you had a weak urinary stream? Less  than 1 in 5 times     How often have you had to strain to start urination? Not at All     How many times did you typically get up at night to urinate? 1 Time     Total IPSS Score 2       Quality of Life due to urinary symptoms   If you were to spend the rest of your life with your urinary condition just the way it is now how would you feel about that? Delighted                Score:  1-7 Mild 8-19 Moderate 20-35 Severe    Patient still having spontaneous erections.  He denies any pain or curvature with erections.   PDE5i's work but not on schedule.     SHIM     Row Name 05/21/22 0932         SHIM: Over the last 6 months:   How do you rate your confidence that you could get and keep an erection? Very Low     When you had erections with sexual stimulation, how often were your erections hard enough for penetration (entering your partner)? Sometimes (about half the time)  During sexual intercourse, how often were you able to maintain your erection after you had penetrated (entered) your partner? A Few Times (much less than half the time)     During sexual intercourse, how difficult was it to maintain your erection to completion of intercourse? Extremely Difficult     When you attempted sexual intercourse, how often was it satisfactory for you? A Few Times (much less than half the time)       SHIM Total Score   SHIM 9                Score: 1-7 Severe ED 8-11 Moderate ED 12-16 Mild-Moderate ED 17-21 Mild ED 22-25 No ED  PMH: Past Medical History:  Diagnosis Date   Hypertension     Surgical History: No past surgical history on file.  Home Medications:  Allergies as of 05/21/2022   No Known Allergies      Medication List        Accurate as of May 21, 2022 10:00 AM. If you have any questions, ask your nurse or doctor.          carboxymethylcellulose 0.5 % Soln Commonly known as: REFRESH PLUS INSTILL 1 DROP IN EACH EYE FIVE TIMES A DAY    finasteride 5 MG tablet Commonly known as: PROSCAR TAKE 1 TABLET(5 MG) BY MOUTH DAILY   lisinopril-hydrochlorothiazide 20-12.5 MG tablet Commonly known as: ZESTORETIC Take by mouth.   PARoxetine 20 MG tablet Commonly known as: PAXIL Take by mouth.   prazosin 1 MG capsule Commonly known as: MINIPRESS TAKE ONE CAPSULE BY MOUTH AT BEDTIME - DO NOT TAKE WITH SILDENAFIL.   sertraline 100 MG tablet Commonly known as: ZOLOFT TAKE ONE-HALF TABLET BY MOUTH EVERY DAY FOR MOOD  DECREASE PAROXETINE FROM 20MG TO 10MG NIGHTLY FOR 3 NIGHTS, THEN STOP.  THE NEXT DAY, START SERTRALINE 50 MG DAILY. FOR MOOD  DECREASE PAROXETINE FROM 20MG TO 10MG NIGHTLY FOR 3 NIGHTS, THEN STOP.  THE NEXT DAY, START SERTRALINE 50 MG DAILY.   sildenafil 100 MG tablet Commonly known as: VIAGRA Take 1 tablet (100 mg total) by mouth daily as needed for erectile dysfunction. Take two hours prior to intercourse on an empty stomach   tadalafil 20 MG tablet Commonly known as: Cialis Take 1 tablet (20 mg total) by mouth daily as needed for erectile dysfunction.   topiramate 25 MG tablet Commonly known as: TOPAMAX Take 1 po qpm x 1 week,then 2 qpm   traZODone 50 MG tablet Commonly known as: DESYREL TAKE ONE TABLET BY MOUTH AT BEDTIME FOR SLEEP        Allergies: No Known Allergies  Family History: Family History  Problem Relation Age of Onset   Prostate cancer Father    Bladder Cancer Neg Hx    Kidney cancer Neg Hx     Social History:  reports that he has never smoked. He has never used smokeless tobacco. He reports that he does not drink alcohol and does not use drugs.  ROS: For pertinent review of systems please refer to history of present illness  Physical Exam: BP 116/75   Pulse 65   Ht 5' 9"  (1.753 m)   Wt 190 lb (86.2 kg)   BMI 28.06 kg/m   Constitutional:  Well nourished. Alert and oriented, No acute distress. HEENT: [place] AT, moist mucus membranes.  Trachea midline Cardiovascular: No  clubbing, cyanosis, or edema. Respiratory: Normal respiratory effort, no increased work of breathing. GU: No CVA tenderness.  No  bladder fullness or masses.  Patient with uncircumcised phallus. Foreskin easily retracted  Urethral meatus is patent.  No penile discharge. No penile lesions or rashes. Scrotum without lesions, cysts, rashes and/or edema.  Testicles are located scrotally bilaterally. No masses are appreciated in the testicles. Left and right epididymis are normal. Rectal: Patient with  normal sphincter tone. Anus and perineum without scarring or rashes. No rectal masses are appreciated. Prostate is approximately 45 grams, no nodules are appreciated. Seminal vesicles could not be palpated Neurologic: Grossly intact, no focal deficits, moving all 4 extremities. Psychiatric: Normal mood and affect.   Laboratory Data: Glucose 70 - 110 mg/dL 91   Sodium 136 - 145 mmol/L 137   Potassium 3.6 - 5.1 mmol/L 4.1   Chloride 97 - 109 mmol/L 104   Carbon Dioxide (CO2) 22.0 - 32.0 mmol/L 24.7   Urea Nitrogen (BUN) 7 - 25 mg/dL 20   Creatinine 0.7 - 1.3 mg/dL 1.0   Glomerular Filtration Rate (eGFR), MDRD Estimate >60 mL/min/1.73sq m 94   Calcium 8.7 - 10.3 mg/dL 9.4   AST  8 - 39 U/L 20   ALT  6 - 57 U/L 28   Alk Phos (alkaline Phosphatase) 34 - 104 U/L 86   Albumin 3.5 - 4.8 g/dL 4.3   Bilirubin, Total 0.3 - 1.2 mg/dL 0.5   Protein, Total 6.1 - 7.9 g/dL 7.0   A/G Ratio 1.0 - 5.0 gm/dL 1.6   Resulting Agency  [facility]   Specimen Collected: 02/08/22 11:12   Performed by: [facility] Last Resulted: 02/08/22 15:50  Received From: [place]  Result Received: 05/16/22 09:22   Color Yellow, Violet, Light Violet, Dark Violet Yellow   Clarity Clear, Other Clear   Specific Gravity 1.000 - 1.030 1.020   pH, Urine 5.0 - 8.0 5.0   Protein, Urinalysis Negative, Trace mg/dL Negative   Glucose, Urinalysis Negative mg/dL Negative   Ketones,  Urinalysis Negative mg/dL Negative   Blood, Urinalysis Negative Negative   Nitrite, Urinalysis Negative Negative   Leukocyte Esterase, Urinalysis Negative Negative   White Blood Cells, Urinalysis None Seen, 0-3 /hpf None Seen   Red Blood Cells, Urinalysis None Seen, 0-3 /hpf None Seen   Bacteria, Urinalysis None Seen /hpf Rare Abnormal    Squamous Epithelial Cells, Urinalysis Rare, Few, None Seen /hpf Rare   Resulting Agency  [place] [place] [facility]   Specimen Collected: 02/07/22 08:52   Performed by: [facility] Last Resulted: 02/07/22 10:25  Received From: [place]  Result Received: 05/16/22 09:22  I have reviewed the labs.   Assessment & Plan:    1. Increase in PSA velocity -PSA trending upward again -will order repeat prostate MRI as it has been three years since his last imaging  2. BPH with LUTS -PSA trending upward  -prostate MRI pending -DRE benign -continue conservative management, avoiding bladder irritants and timed voiding's -Continue finasteride 5 mg daily  3. ED -continue sildenafil 100 mg, on-demand-dosing -continue tadalafil 20 mg, on-demand-dosing -patient finds both medications effective, but cost is a factor at times , so he will fill which one is more cost prohibitive  4. Gross hematuria - I explained to the patient that there are a number of causes that can be associated with blood in the urine, such as stones, BPH, UTI's, damage to the urinary tract and/or cancer. -at this time, they are in a high risk stratification for  hematuria per AUA guidelines due to gross heme  -recommended studies for high risk are CT urogram and cysto - I explained to the patient that a contrast material will be injected into a vein and that in rare instances, an allergic reaction can result and may even life threatening (1:100,000)  The patient denies any allergies to contrast, iodine and/or seafood and is not taking metformin. - Following  the imaging study,  I've recommended a cystoscopy. I described how this is performed, typically in an office setting with a flexible cystoscope. We described the risks, benefits, and possible side effects, the most common of which is a minor amount of blood in the urine and/or burning which usually resolves in 24 to 48 hours.   - The patient had the opportunity to ask questions which were answered. Based upon this discussion, the patient is willing to proceed -He would like to address this in a stepwise fashion, so we have the prostate MRI first and follow through with those findings millimeter proceed with the hematuria work-up    Return for I will call patient with results.  These notes generated with voice recognition software. I apologize for typographical errors.  Zara Council, PA-C  [facility] [address] [place], [place] [address] [phone]

## 2022-05-21 ENCOUNTER — Encounter: Payer: Self-pay | Admitting: Urology

## 2022-05-21 ENCOUNTER — Ambulatory Visit: Payer: Federal, State, Local not specified - PPO | Admitting: Urology

## 2022-05-21 VITALS — BP 116/75 | HR 65 | Ht 69.0 in | Wt 190.0 lb

## 2022-05-21 DIAGNOSIS — N401 Enlarged prostate with lower urinary tract symptoms: Secondary | ICD-10-CM

## 2022-05-21 DIAGNOSIS — R310 Gross hematuria: Secondary | ICD-10-CM | POA: Diagnosis not present

## 2022-05-21 DIAGNOSIS — R972 Elevated prostate specific antigen [PSA]: Secondary | ICD-10-CM

## 2022-05-21 DIAGNOSIS — N529 Male erectile dysfunction, unspecified: Secondary | ICD-10-CM | POA: Diagnosis not present

## 2022-05-21 DIAGNOSIS — N138 Other obstructive and reflux uropathy: Secondary | ICD-10-CM

## 2022-05-21 MED ORDER — FINASTERIDE 5 MG PO TABS: ORAL_TABLET | ORAL | 3 refills | Status: AC

## 2022-05-21 MED ORDER — SILDENAFIL CITRATE 100 MG PO TABS: 100.0000 mg | ORAL_TABLET | Freq: Every day | ORAL | 3 refills | Status: AC | PRN

## 2022-05-21 MED ORDER — TADALAFIL 20 MG PO TABS: 20.0000 mg | ORAL_TABLET | Freq: Every day | ORAL | 3 refills | Status: AC | PRN

## 2022-06-05 ENCOUNTER — Ambulatory Visit: Payer: Federal, State, Local not specified - PPO

## 2022-07-31 ENCOUNTER — Telehealth: Payer: Self-pay

## 2022-07-31 NOTE — Telephone Encounter (Signed)
-----   Message from [name], PA-C sent at 07/28/2022 12:21 PM EDT ----- Please call Mr. Hattabaugh and have him reschedule his prostate MRI and we also need a free and total PSA prior.

## 2022-07-31 NOTE — Telephone Encounter (Signed)
Pt states he was scheduled for prostate MRI here at [facility] and had been told it was going to cost $500. A day before his appt they called him and told him it was going to be $1200. Pt decided to have MRI at [facility] instead.

## 2022-08-07 NOTE — Telephone Encounter (Signed)
Pt states he had prostate MRI done 07/25/22. He states he will drop off a copy of the records for you to review.

## 2022-09-26 NOTE — Telephone Encounter (Signed)
Pt states he was waiting until he had an appt. Appears he does not have an upcoming appt. Pt states he will drop them off in [place] on 10/30 when Larene [place] is there.

## 2022-12-16 ENCOUNTER — Telehealth: Payer: Self-pay | Admitting: Urology

## 2022-12-16 NOTE — Telephone Encounter (Signed)
Pt had a CT scan done at the [place] and is wants to drop by the [facility] office to give it to [employer] today.  Thank you

## 2023-01-04 ENCOUNTER — Other Ambulatory Visit: Payer: Self-pay | Admitting: Urology

## 2023-01-04 DIAGNOSIS — N138 Other obstructive and reflux uropathy: Secondary | ICD-10-CM
